# Patient Record
Sex: Female | Born: 1975 | Race: White | Hispanic: No | Marital: Married | Smoking: Never smoker
Health system: Southern US, Community
[De-identification: ages and names within clinical notes are randomized; demographics above are authoritative.]

## PROBLEM LIST (undated history)

## (undated) DIAGNOSIS — A048 Other specified bacterial intestinal infections: Secondary | ICD-10-CM

## (undated) DIAGNOSIS — I471 Supraventricular tachycardia, unspecified: Secondary | ICD-10-CM

## (undated) DIAGNOSIS — O24419 Gestational diabetes mellitus in pregnancy, unspecified control: Secondary | ICD-10-CM

## (undated) DIAGNOSIS — Z8742 Personal history of other diseases of the female genital tract: Secondary | ICD-10-CM

## (undated) DIAGNOSIS — K589 Irritable bowel syndrome without diarrhea: Secondary | ICD-10-CM

## (undated) DIAGNOSIS — R002 Palpitations: Secondary | ICD-10-CM

## (undated) DIAGNOSIS — F419 Anxiety disorder, unspecified: Secondary | ICD-10-CM

## (undated) DIAGNOSIS — K469 Unspecified abdominal hernia without obstruction or gangrene: Secondary | ICD-10-CM

## (undated) DIAGNOSIS — D126 Benign neoplasm of colon, unspecified: Secondary | ICD-10-CM

## (undated) HISTORY — DX: Other specified bacterial intestinal infections: A04.8

## (undated) HISTORY — DX: Palpitations: R00.2

## (undated) HISTORY — DX: Unspecified abdominal hernia without obstruction or gangrene: K46.9

## (undated) HISTORY — PX: PLACEMENT OF BREAST IMPLANTS: SHX6334

## (undated) HISTORY — DX: Gestational diabetes mellitus in pregnancy, unspecified control: O24.419

## (undated) HISTORY — DX: Supraventricular tachycardia: I47.1

## (undated) HISTORY — DX: Irritable bowel syndrome, unspecified: K58.9

## (undated) HISTORY — DX: Benign neoplasm of colon, unspecified: D12.6

## (undated) HISTORY — DX: Personal history of other diseases of the female genital tract: Z87.42

## (undated) HISTORY — DX: Supraventricular tachycardia, unspecified: I47.10

## (undated) HISTORY — DX: Anxiety disorder, unspecified: F41.9

## (undated) HISTORY — PX: OTHER SURGICAL HISTORY: SHX169

---

## 2000-03-03 ENCOUNTER — Other Ambulatory Visit: Admission: RE | Admit: 2000-03-03 | Discharge: 2000-03-03 | Payer: Self-pay | Admitting: Gynecology

## 2005-02-13 ENCOUNTER — Ambulatory Visit: Payer: Self-pay | Admitting: Internal Medicine

## 2008-02-15 ENCOUNTER — Inpatient Hospital Stay (HOSPITAL_COMMUNITY): Admission: AD | Admit: 2008-02-15 | Discharge: 2008-02-22 | Payer: Self-pay | Admitting: Obstetrics & Gynecology

## 2008-02-15 ENCOUNTER — Ambulatory Visit: Payer: Self-pay | Admitting: Family

## 2009-05-13 ENCOUNTER — Emergency Department (HOSPITAL_COMMUNITY): Admission: EM | Admit: 2009-05-13 | Discharge: 2009-05-13 | Payer: Self-pay | Admitting: Emergency Medicine

## 2009-05-24 HISTORY — PX: DOPPLER ECHOCARDIOGRAPHY: SHX263

## 2009-08-30 ENCOUNTER — Encounter (HOSPITAL_COMMUNITY): Admission: RE | Admit: 2009-08-30 | Discharge: 2009-10-17 | Payer: Self-pay | Admitting: Cardiology

## 2009-08-30 HISTORY — PX: OTHER SURGICAL HISTORY: SHX169

## 2010-01-11 IMAGING — US US OB COMP EACH ADDL GEST+14 WKS
1 series · 14 of 28 positions shown · non-contrast
Comparison: none

OBSTETRICAL ULTRASOUND:
 This ultrasound exam was performed in the [HOSPITAL] Ultrasound Department.  The OB US report was generated in the AS system, and faxed to the ordering physician.  This report is also available in [REDACTED] PACS.

[Series 1: us ob comp +14 wk · 14 of 77 slices shown]
[im 3/77]
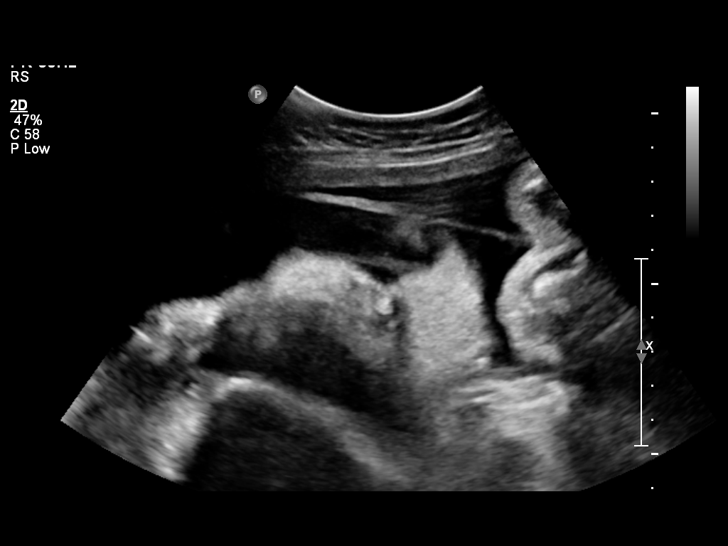
[im 9/77]
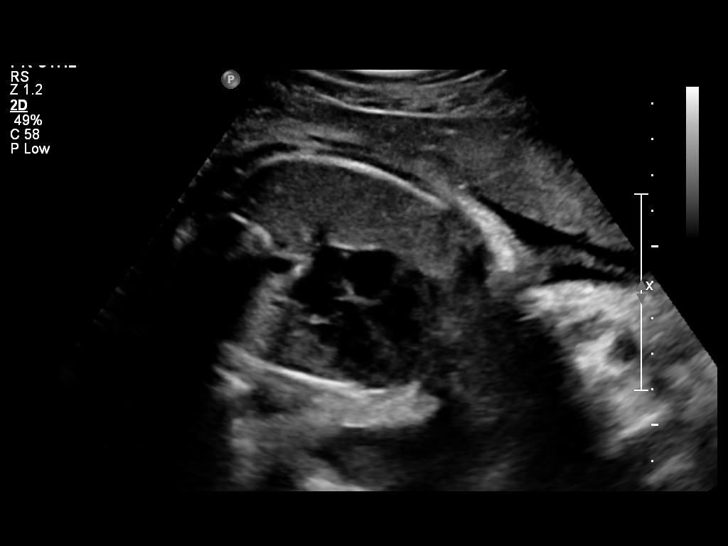
[im 15/77]
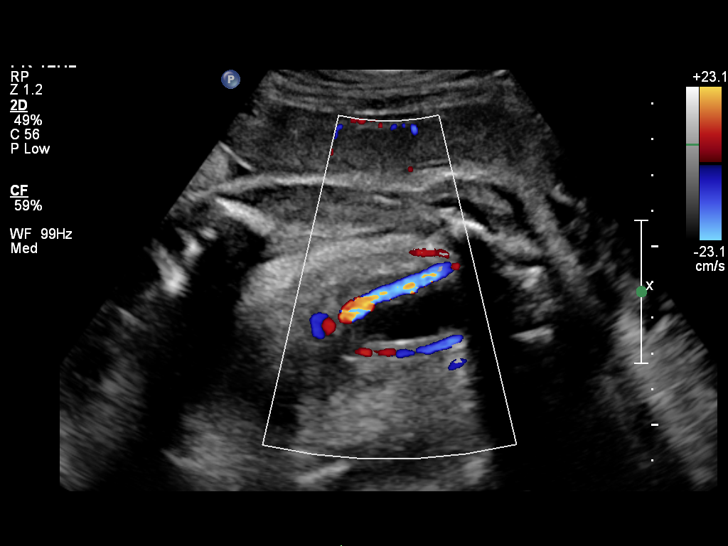
[im 20/77]
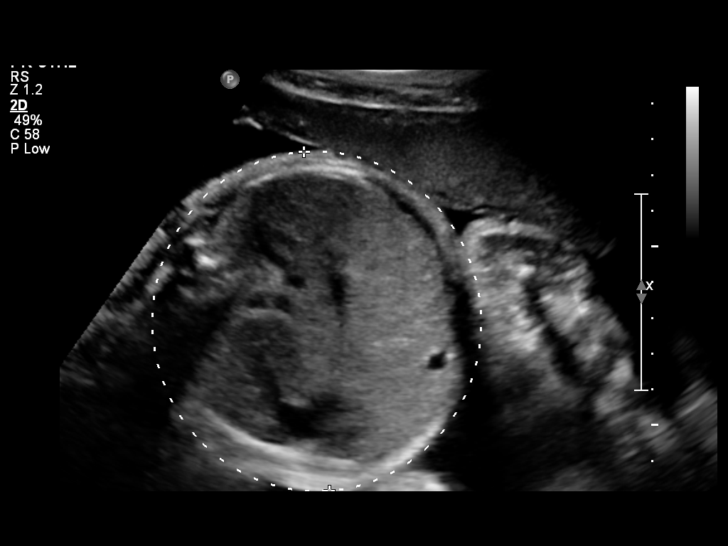
[im 26/77]
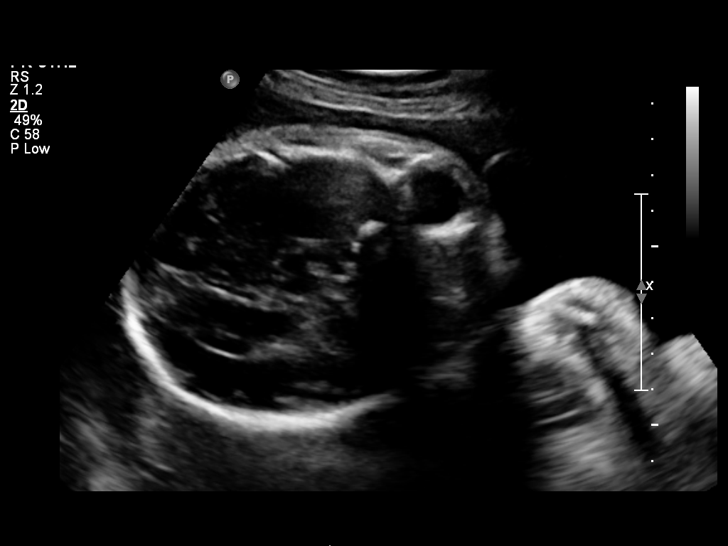
[im 31/77]
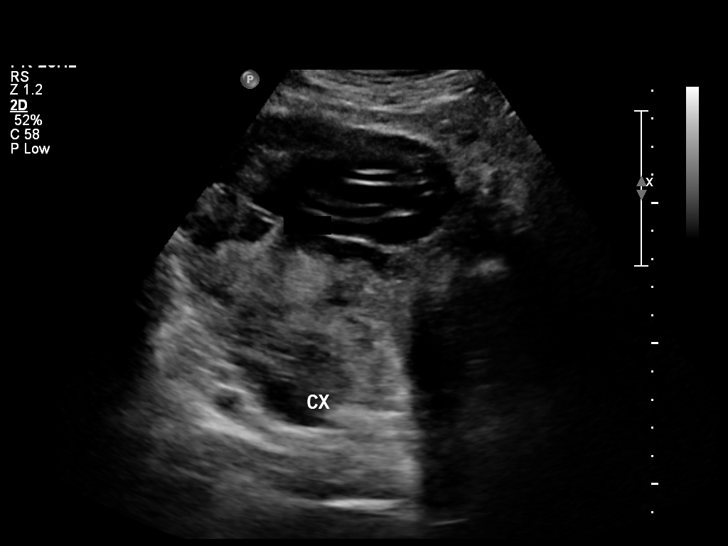
[im 37/77]
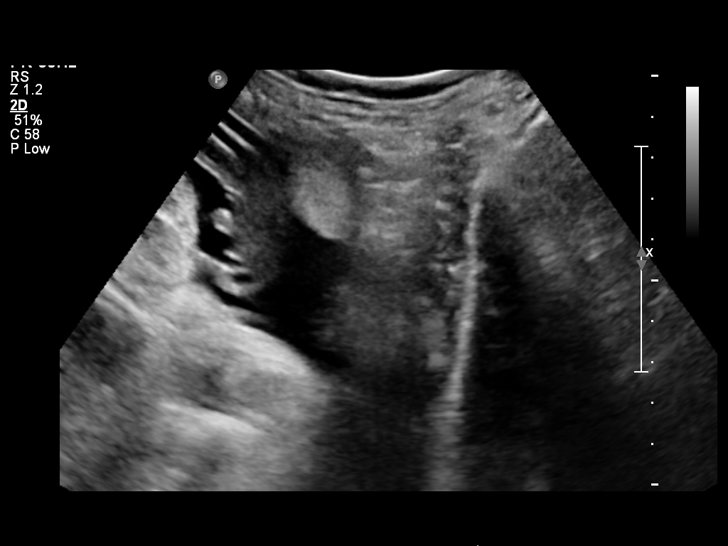
[im 43/77]
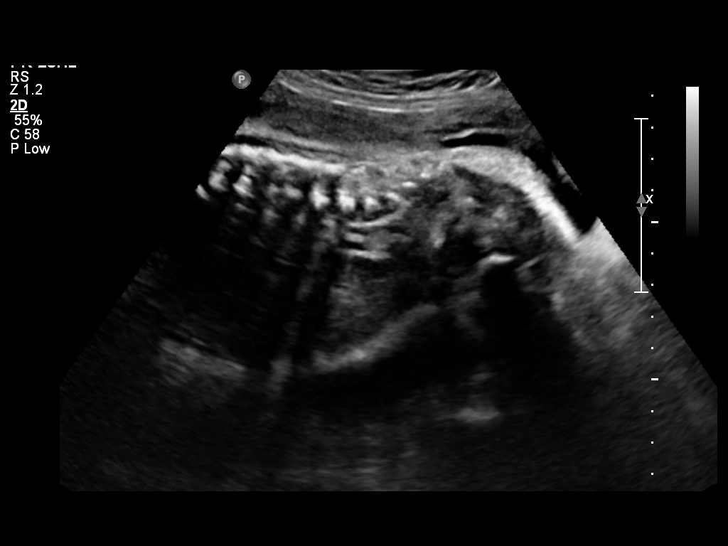
[im 48/77]
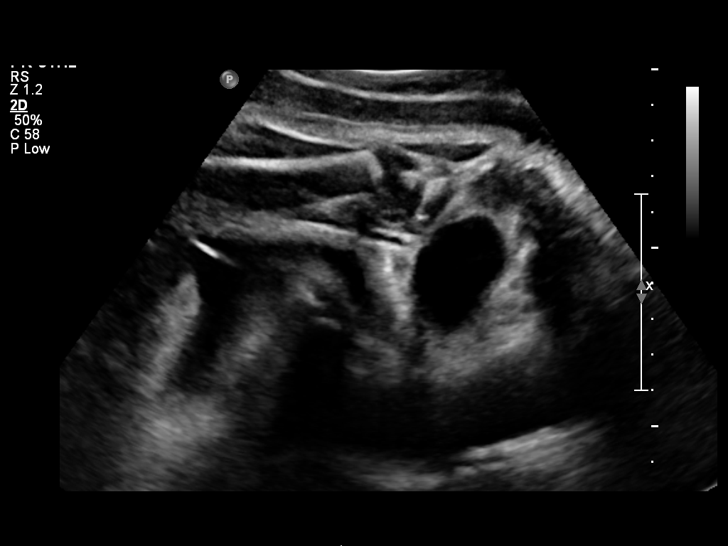
[im 54/77]
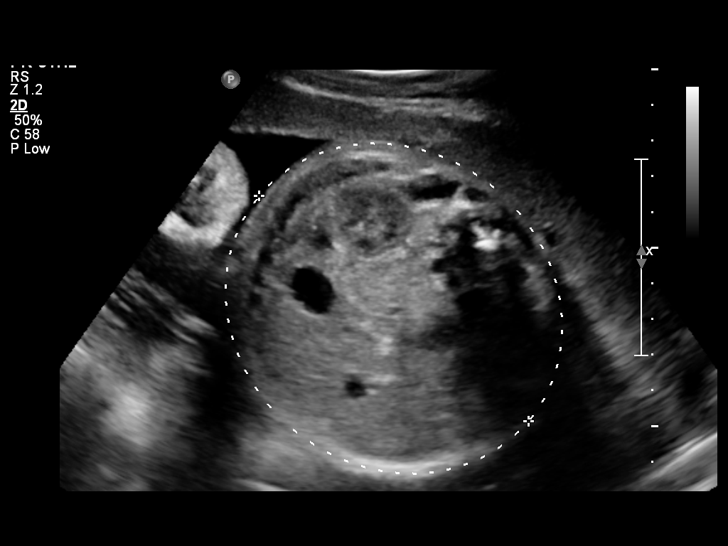
[im 60/77]
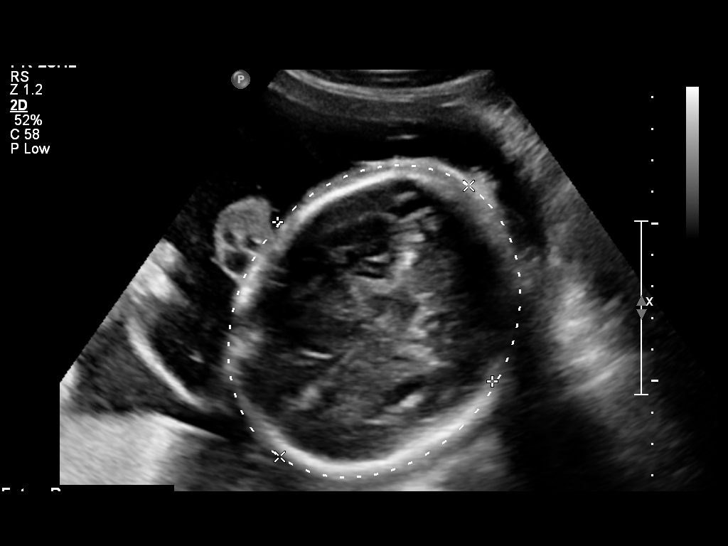
[im 65/77]
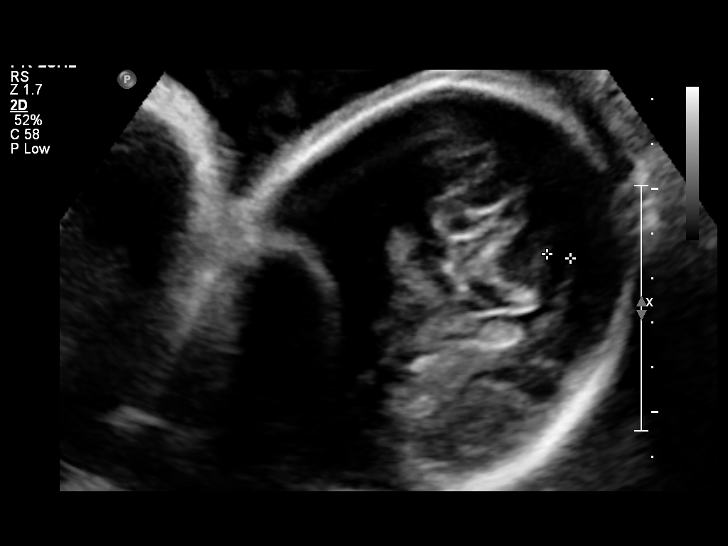
[im 71/77]
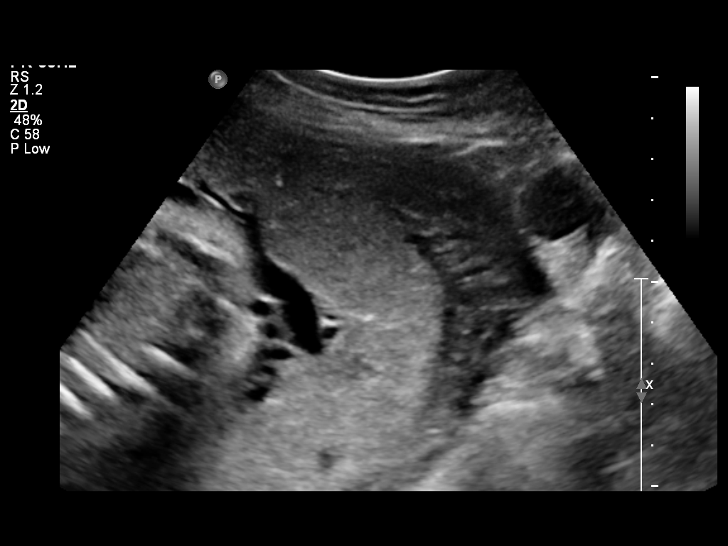
[im 77/77]
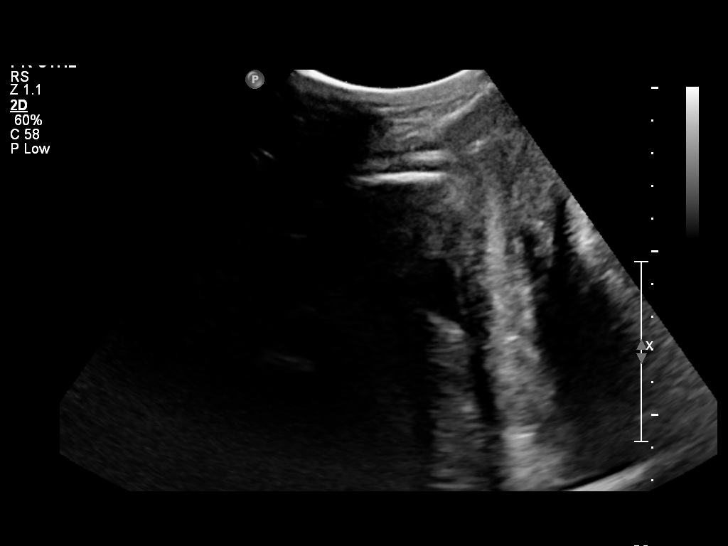

[14 of 28 positions shown; findings below may reference images not displayed]

IMPRESSION: See AS Obstetric US report.

## 2010-04-15 LAB — URINALYSIS, ROUTINE W REFLEX MICROSCOPIC
Bilirubin Urine: NEGATIVE
Hgb urine dipstick: NEGATIVE
Ketones, ur: 15 mg/dL — AB
Protein, ur: NEGATIVE mg/dL
Specific Gravity, Urine: 1.004 — ABNORMAL LOW (ref 1.005–1.030)
pH: 7 (ref 5.0–8.0)

## 2010-04-15 LAB — POCT I-STAT, CHEM 8
BUN: 7 mg/dL (ref 6–23)
Chloride: 104 mEq/L (ref 96–112)
HCT: 36 % (ref 36.0–46.0)
Hemoglobin: 12.2 g/dL (ref 12.0–15.0)
Sodium: 139 mEq/L (ref 135–145)

## 2010-04-15 LAB — DIFFERENTIAL
Basophils Relative: 0 % (ref 0–1)
Eosinophils Relative: 1 % (ref 0–5)
Lymphocytes Relative: 25 % (ref 12–46)
Monocytes Absolute: 0.5 10*3/uL (ref 0.1–1.0)
Monocytes Relative: 7 % (ref 3–12)

## 2010-04-15 LAB — CBC
MCV: 93.5 fL (ref 78.0–100.0)
RBC: 3.65 MIL/uL — ABNORMAL LOW (ref 3.87–5.11)
RDW: 13 % (ref 11.5–15.5)
WBC: 8.4 10*3/uL (ref 4.0–10.5)

## 2010-04-15 LAB — POCT CARDIAC MARKERS
Myoglobin, poc: 38.3 ng/mL (ref 12–200)
Myoglobin, poc: 78.9 ng/mL (ref 12–200)
Troponin i, poc: <0.05 ng/mL (ref 0.00–0.09)

## 2010-04-15 LAB — D-DIMER, QUANTITATIVE: D-Dimer, Quant: <0.22 ug/mL-FEU (ref 0.00–0.48)

## 2010-04-15 LAB — PROTIME-INR: INR: 1.11 (ref 0.00–1.49)

## 2010-05-12 LAB — URINALYSIS, ROUTINE W REFLEX MICROSCOPIC
Glucose, UA: NEGATIVE mg/dL
Ketones, ur: NEGATIVE mg/dL
Leukocytes, UA: NEGATIVE
Protein, ur: NEGATIVE mg/dL
Urobilinogen, UA: 0.2 mg/dL (ref 0.0–1.0)

## 2010-05-12 LAB — GLUCOSE, CAPILLARY
Glucose-Capillary: 114 mg/dL — ABNORMAL HIGH (ref 70–99)
Glucose-Capillary: 118 mg/dL — ABNORMAL HIGH (ref 70–99)
Glucose-Capillary: 120 mg/dL — ABNORMAL HIGH (ref 70–99)
Glucose-Capillary: 124 mg/dL — ABNORMAL HIGH (ref 70–99)
Glucose-Capillary: 67 mg/dL — ABNORMAL LOW (ref 70–99)
Glucose-Capillary: 67 mg/dL — ABNORMAL LOW (ref 70–99)
Glucose-Capillary: 74 mg/dL (ref 70–99)
Glucose-Capillary: 75 mg/dL (ref 70–99)
Glucose-Capillary: 79 mg/dL (ref 70–99)
Glucose-Capillary: 83 mg/dL (ref 70–99)
Glucose-Capillary: 90 mg/dL (ref 70–99)

## 2010-05-12 LAB — CROSSMATCH
ABO/RH(D): A POS
Antibody Screen: NEGATIVE
Antibody Screen: NEGATIVE

## 2010-05-12 LAB — ABO/RH: ABO/RH(D): A POS

## 2010-05-12 LAB — CBC
HCT: 30.6 % — ABNORMAL LOW (ref 36.0–46.0)
Hemoglobin: 10.6 g/dL — ABNORMAL LOW (ref 12.0–15.0)
MCV: 98.8 fL (ref 78.0–100.0)
Platelets: 119 10*3/uL — ABNORMAL LOW (ref 150–400)
RBC: 3.1 MIL/uL — ABNORMAL LOW (ref 3.87–5.11)
WBC: 8.9 10*3/uL (ref 4.0–10.5)

## 2010-06-10 NOTE — Discharge Summary (Signed)
[name], Amber Case                ACCOUNT NO.:  [id number]   MEDICAL RECORD NO.:  [id number]          PATIENT TYPE:  INP   LOCATION:  9157                          FACILITY:  [facility]   PHYSICIAN:  Tanya S. Shawnie Pons, M.D.   DATE OF BIRTH:  1975-05-31   DATE OF ADMISSION:  02/15/2008  DATE OF DISCHARGE:  02/22/2008                               DISCHARGE SUMMARY   ADMISSION DIAGNOSES:  1. A 32-6/7 week intrauterine pregnancy.  2. Dichorionic-diamniotic twin gestation.  3. Complete placenta previa.  4. A1gestational diabetes mellitus.   DISCHARGE DIAGNOSES:  1. A 33-6/7 week intrauterine pregnancy.  2. Complete posterior previa.  3. Hemodynamically stable.  4. Dichorionic-diamniotic twin gestation.  5. A1 gestational diabetes mellitus well controlled.   HISTORY:  This is a 35 year old G2, P1-0-0-1 who received her care with  Dr. Allena Katz at [facility].  As instructed, she presented  at [facility] when she had her first episode of vaginal  bleeding on February 15, 2008 with a known posterior placenta previa.  She had scant amount of blood and irregular contractions for the first  couple of days, then had no active bleeding.  She did remain  hemodynamically stable throughout.  Her hemoglobin was 10.6.  She  received betamethasone x2 and short course of Procardia, which was  discontinued February 21, 2008.  Her CBG were within target range other  than some mild elevation after she received betamethasone.  Her  discharge fasting CBG is 67.  Her ultrasound of February 16, 2008 shows  di-di concordant twin.  AGA asymmetric and a cervical length of 4.3.  Fetal well-being was documented by intermittent monitoring.  On February 22, 2008, she was discharged home in stable condition.   MEDICATIONS:  Prenatal vitamins one a day.   INSTRUCTIONS:  Pelvic rest and modified bedrest.  She will do home  glucose monitoring as advised by her primary MD.  She will have a  followup appointment with Dr. Allena Katz Tuesday in 6 days.      Deirdre Christy Gentles, C.N.M.      Shelbie Proctor. Shawnie Pons, M.D.  Electronically Signed   DP/MEDQ  D:  02/22/2008  T:  02/23/2008  Job:  [id number]

## 2011-06-17 ENCOUNTER — Encounter: Payer: Self-pay | Admitting: *Deleted

## 2014-09-24 DIAGNOSIS — N6009 Solitary cyst of unspecified breast: Secondary | ICD-10-CM | POA: Insufficient documentation

## 2014-09-24 DIAGNOSIS — N63 Unspecified lump in unspecified breast: Secondary | ICD-10-CM | POA: Insufficient documentation

## 2015-08-01 DIAGNOSIS — F410 Panic disorder [episodic paroxysmal anxiety] without agoraphobia: Secondary | ICD-10-CM | POA: Insufficient documentation

## 2016-01-07 DIAGNOSIS — K581 Irritable bowel syndrome with constipation: Secondary | ICD-10-CM | POA: Insufficient documentation

## 2016-10-29 DIAGNOSIS — I471 Supraventricular tachycardia: Secondary | ICD-10-CM | POA: Insufficient documentation

## 2016-12-03 DIAGNOSIS — R9439 Abnormal result of other cardiovascular function study: Secondary | ICD-10-CM | POA: Insufficient documentation

## 2016-12-24 DIAGNOSIS — K760 Fatty (change of) liver, not elsewhere classified: Secondary | ICD-10-CM | POA: Insufficient documentation

## 2017-11-17 LAB — HM MAMMOGRAPHY

## 2017-11-29 LAB — HM PAP SMEAR: HM Pap smear: NEGATIVE

## 2017-12-02 ENCOUNTER — Ambulatory Visit (INDEPENDENT_AMBULATORY_CARE_PROVIDER_SITE_OTHER): Payer: PRIVATE HEALTH INSURANCE | Admitting: Osteopathic Medicine

## 2017-12-02 ENCOUNTER — Encounter: Payer: Self-pay | Admitting: Osteopathic Medicine

## 2017-12-02 VITALS — BP 105/68 | HR 68 | Temp 98.4°F | Ht 64.0 in | Wt 130.0 lb

## 2017-12-02 DIAGNOSIS — K581 Irritable bowel syndrome with constipation: Secondary | ICD-10-CM

## 2017-12-02 DIAGNOSIS — F419 Anxiety disorder, unspecified: Secondary | ICD-10-CM | POA: Diagnosis not present

## 2017-12-02 DIAGNOSIS — I471 Supraventricular tachycardia: Secondary | ICD-10-CM

## 2017-12-02 DIAGNOSIS — F410 Panic disorder [episodic paroxysmal anxiety] without agoraphobia: Secondary | ICD-10-CM

## 2017-12-02 DIAGNOSIS — K760 Fatty (change of) liver, not elsewhere classified: Secondary | ICD-10-CM

## 2017-12-02 DIAGNOSIS — Z8632 Personal history of gestational diabetes: Secondary | ICD-10-CM

## 2017-12-02 DIAGNOSIS — R002 Palpitations: Secondary | ICD-10-CM

## 2017-12-02 MED ORDER — BUPROPION HCL ER (XL) 150 MG PO TB24: 150.0000 mg | ORAL_TABLET | ORAL | 0 refills | Status: DC

## 2017-12-02 MED ORDER — LORAZEPAM 0.5 MG PO TABS: 0.5000 mg | ORAL_TABLET | Freq: Two times a day (BID) | ORAL | 0 refills | Status: DC | PRN

## 2017-12-02 MED ORDER — SERTRALINE HCL 50 MG PO TABS: 50.0000 mg | ORAL_TABLET | Freq: Every day | ORAL | 3 refills | Status: DC

## 2017-12-02 NOTE — Progress Notes (Signed)
HPI: Amber Case is a 42 y.o. female who  has a past medical history of Gestational diabetes and Palpitations.  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  Concerned about anxiety, difficult time focusing.   Previous psych meds per record review:  Sertaline 25 --> 100 mg last Rx 07/2017 at 100 mg Ativan 0.5 mg 07/2016, 07/2017 Celexa 20 mg in 2013  Above list was confirmed with patient, she states the Celexa caused anxiety to be worse, Ativan was used very infrequently, she did pretty well on the sertraline 50 mg, was cutting 100 mg tablets in half for a while.  Cardiac CT scan 11/2016 to follow-up on abnormal stress test, coronary artery calcium score 0, normal LV EF 60 to 65%, thoracic aorta appears normal.  Fatty liver was demonstrated on the CT.   History of IBS-C and fatty liver, visit with GI 01/21/2017.  Right upper quadrant ultrasound 01/01/2017 showed normal echotexture of the liver, normal LFTs, it is recommended to go by the CT.  Patient had made some lifestyle modifications including diet/exercise and limiting alcohol to help with the fatty liver issue.  Radiology notes reviewed, abnormal echo concerning for possible ischemia but normal EKG, given low risk, patient was scheduled for CT to evaluate coronary arteries  She reports occasional palpitations, used to have runs of really fast heartbeat, now seems to be noticing occasional squeezing in the chest, triggered more by anxiety, occasionally with caffeine. Other preventive care:  Never smoker Rare EtOH use Monogamous, cisgender, heterosexual, declines STI testing  Following w/ OBGYN for Pap, Mammo Reprots Tdap 2017 No Flu  vax  No FH Breast/Colon/Ovarian CA      Past medical, surgical, social and family history reviewed:  Patient Active Problem  List   Diagnosis Date Noted  . Anxiety 12/02/2017  . Fatty infiltration of liver 12/24/2016  . Abnormal stress echo 12/03/2016  . PSVT (paroxysmal supraventricular tachycardia) ([place]) 10/29/2016  . Irritable bowel syndrome with constipation 01/07/2016  . Panic disorder 08/01/2015  . Breast cyst 09/24/2014    Past Surgical History:  Procedure Laterality Date  . CESAREAN SECTION    . DOPPLER ECHOCARDIOGRAPHY  05/24/2009   trace pulmonary regurgitation.  trace tricuspid regurgitation. LV normal.  Trace mitral regurgitation.    . heart monitor     NO RESULT READ  . nuclear testing  08/30/2009   NM MYOCAR MULTI w/SPECT; Mild septal hypokinesis.  EF 63%    Social History   Tobacco Use  . Smoking status: Never Smoker  Substance Use Topics  . Alcohol use: No    Family History  Problem Relation Age of Onset  . Heart murmur Mother      Current medication list and allergy/intolerance information reviewed:    Current Outpatient Medications  Medication Sig Dispense Refill  . citalopram (CELEXA) 20 MG tablet Take 20 mg by mouth daily.    Marland Kitchen LORazepam (ATIVAN) 0.5 MG tablet Take 0.5 mg by mouth every 8 (eight) hours.     No current facility-administered medications for this visit.     Allergies  Allergen Reactions  . Sulfonamide Derivatives       Review of Systems:  Constitutional:  No  fever, no chills, No recent illness, No unintentional weight changes. No significant fatigue.   HEENT: No  headache, no vision change, no hearing change,  No sore throat, No  sinus pressure  Cardiac: No  chest pain, No  pressure, No palpitations, No  Orthopnea  Respiratory:  No  shortness of breath. No  Cough  Gastrointestinal: No  abdominal pain, No  nausea, No  vomiting,  No  blood in stool, +diarrhea, No  constipation   Musculoskeletal: No new myalgia/arthralgia  Skin: No  Rash, No other wounds/concerning lesions  Genitourinary: No  incontinence, No  abnormal genital bleeding, No  abnormal genital discharge  Hem/Onc: No  easy bruising/bleeding, No  abnormal lymph node  Endocrine: No cold intolerance,  No heat intolerance. No polyuria/polydipsia/polyphagia   Neurologic: No  weakness, No  dizziness, No  slurred speech/focal weakness/facial droop  Psychiatric: No  concerns with depression, +concerns with anxiety, No sleep problems, No mood problems  Depression screen PHQ 2/9 12/02/2017  Decreased Interest 0  Down, Depressed, Hopeless 1  PHQ - 2 Score 1   GAD 7 : Generalized Anxiety Score 12/02/2017  Nervous, Anxious, on Edge 2  Control/stop worrying 2  Worry too much - different things 2  Trouble relaxing 2  Restless 1  Easily annoyed or irritable 2  Afraid - awful might happen 1  Total GAD 7 Score 12  Anxiety Difficulty Somewhat difficult      Exam:  BP 105/68 (BP Location: Left Arm, Patient Position: Sitting, Cuff Size: Small)   Pulse 68   Temp 98.4 F (36.9 C) (Oral)   Ht 5\' 4"  (1.626 m)   Wt 130 lb (59 kg)   BMI 22.31 kg/m   Constitutional: VS see above. General Appearance: alert, well-developed, well-nourished, NAD  Eyes: Normal lids and conjunctive, non-icteric sclera  Ears, Nose, Mouth, Throat: MMM, Normal external inspection ears/nares/mouth/lips/gums. TM normal bilaterally. Pharynx/tonsils no erythema, no exudate. Nasal mucosa normal.   Neck: No masses, trachea midline. No thyroid enlargement. No tenderness/mass appreciated. No lymphadenopathy  Respiratory: Normal respiratory effort. no wheeze, no rhonchi, no rales  Cardiovascular: S1/S2 normal, no murmur, no rub/gallop auscultated. RRR. No lower extremity edema. Pedal pulse II/IV bilaterally DP and PT. No carotid bruit or JVD. No abdominal aortic bruit.  Gastrointestinal: Nontender, no masses. No hepatomegaly, no splenomegaly. No hernia appreciated. Bowel sounds normal. Rectal exam deferred.   Musculoskeletal: Gait normal. No clubbing/cyanosis of digits.   Neurological: Normal  balance/coordination. No tremor. No cranial nerve deficit on limited exam. Motor and sensation intact and symmetric. Cerebellar reflexes intact.   Skin: warm, dry, intact. No rash/ulcer. No concerning nevi or subq nodules on limited exam.    Psychiatric: Normal judgment/insight. Normal mood and affect. Oriented x3.      ASSESSMENT/PLAN: The primary encounter diagnosis was Anxiety. Diagnoses of Panic disorder, PSVT (paroxysmal supraventricular tachycardia) ([place]), Irritable bowel syndrome with constipation, Fatty infiltration of liver, History of gestational diabetes, and Palpitations were also pertinent to this visit.   Orders Placed This Encounter  Procedures  . CBC  . COMPLETE METABOLIC PANEL WITH GFR  . TSH  . Hemoglobin A1c  . Holter monitor - 48 hour   Capitation sound most likely PVCs.  Given normal cardiac work-up a year ago for CAD, will defer EKG today and cardiology referral, will get Holter monitor to confirm PVC and consider for cardiology follow-up.  We discussed possibility of trying Wellbutrin given concentration issues and possible ADHD history, patient is nervous to try something different, since she did okay on the Zoloft before she would like to start this again.   Patient Instructions  Try putting you back on sertraline/Zoloft  at 50 mg.  Please let me know if you would like to pursue testing for attention deficit disorder, I can place a referral for psychological evaluation.  Let's plan to follow-up in 4 to 6 weeks to see how you are doing on the medicine, come see me sooner if needed!  We will go ahead and refill Ativan to have on hand as needed.  We will get some blood work today to evaluate other possible causes for change in anxiety/mood with palpitations and also to monitor liver function.  Will get Holter monitor to evaluate heart palpitations.  If you do not get a call sometime in the next week to get the Holter monitor set up, please let us  know.      Visit summary with medication list and pertinent instructions was printed for patient to review. All questions at time of visit were answered - patient instructed to contact office with any additional concerns. ER/RTC precautions were reviewed with the patient.   Follow-up plan: Return for Check anxiety in 4 to 6 weeks, sooner as needed.   Please note: voice recognition software was used to produce this document, and typos may escape review. Please contact Dr. Sheppard Coil for any needed clarifications.

## 2017-12-02 NOTE — Patient Instructions (Addendum)
Try putting you back on sertraline/Zoloft at 50 mg.  Please let me know if you would like to pursue testing for attention deficit disorder, I can place a referral for psychological evaluation.  Let's plan to follow-up in 4 to 6 weeks to see how you are doing on the medicine, come see me sooner if needed!  We will go ahead and refill Ativan to have on hand as needed.  We will get some blood work today to evaluate other possible causes for change in anxiety/mood with palpitations and also to monitor liver function.  Will get Holter monitor to evaluate heart palpitations.  If you do not get a call sometime in the next week to get the Holter monitor set up, please let us know.

## 2017-12-03 ENCOUNTER — Telehealth: Payer: Self-pay | Admitting: Osteopathic Medicine

## 2017-12-03 LAB — COMPLETE METABOLIC PANEL WITH GFR
AG Ratio: 1.8 (calc) (ref 1.0–2.5)
ALKALINE PHOSPHATASE (APISO): 38 U/L (ref 33–115)
ALT: 10 U/L (ref 6–29)
AST: 15 U/L (ref 10–30)
Albumin: 4.4 g/dL (ref 3.6–5.1)
BILIRUBIN TOTAL: 0.4 mg/dL (ref 0.2–1.2)
BUN: 9 mg/dL (ref 7–25)
CHLORIDE: 105 mmol/L (ref 98–110)
CO2: 28 mmol/L (ref 20–32)
CREATININE: 0.78 mg/dL (ref 0.50–1.10)
Calcium: 9.3 mg/dL (ref 8.6–10.2)
GFR, Est African American: 109 mL/min/{1.73_m2} (ref >=60)
GFR, Est Non African American: 94 mL/min/{1.73_m2} (ref >=60)
Globulin: 2.4 g/dL (calc) (ref 1.9–3.7)
Glucose, Bld: 93 mg/dL (ref 65–99)
Potassium: 4.2 mmol/L (ref 3.5–5.3)
Sodium: 138 mmol/L (ref 135–146)
TOTAL PROTEIN: 6.8 g/dL (ref 6.1–8.1)

## 2017-12-03 LAB — CBC
HEMATOCRIT: 37 % (ref 35.0–45.0)
HEMOGLOBIN: 12.5 g/dL (ref 11.7–15.5)
MCH: 32.1 pg (ref 27.0–33.0)
MCHC: 33.8 g/dL (ref 32.0–36.0)
MCV: 95.1 fL (ref 80.0–100.0)
MPV: 12 fL (ref 7.5–12.5)
PLATELETS: 247 10*3/uL (ref 140–400)
RBC: 3.89 10*6/uL (ref 3.80–5.10)
RDW: 12.2 % (ref 11.0–15.0)
WBC: 7.7 10*3/uL (ref 3.8–10.8)

## 2017-12-03 LAB — HEMOGLOBIN A1C
Hgb A1c MFr Bld: 5.2 % of total Hgb (ref <5.7)
Mean Plasma Glucose: 103 (calc)
eAG (mmol/L): 5.7 (calc)

## 2017-12-03 LAB — TSH: TSH: 1.89 mIU/L

## 2017-12-03 NOTE — Telephone Encounter (Signed)
LAM for patient to call back to schedule monitor appt.

## 2017-12-06 ENCOUNTER — Telehealth: Payer: Self-pay | Admitting: Osteopathic Medicine

## 2017-12-06 NOTE — Telephone Encounter (Signed)
LAM for patient to call back regarding scheduling 48 hour monitor

## 2017-12-07 ENCOUNTER — Telehealth: Payer: Self-pay | Admitting: *Deleted

## 2017-12-07 NOTE — Telephone Encounter (Signed)
Pt has appt on Monday, 11/18 for monitor and I was calling to see if we could reschedule her for Tues the 19th or Thurs. The 21st

## 2017-12-14 ENCOUNTER — Ambulatory Visit (INDEPENDENT_AMBULATORY_CARE_PROVIDER_SITE_OTHER): Payer: PRIVATE HEALTH INSURANCE

## 2017-12-14 DIAGNOSIS — R002 Palpitations: Secondary | ICD-10-CM | POA: Diagnosis not present

## 2017-12-24 ENCOUNTER — Other Ambulatory Visit: Payer: Self-pay | Admitting: Osteopathic Medicine

## 2017-12-24 DIAGNOSIS — F419 Anxiety disorder, unspecified: Secondary | ICD-10-CM

## 2017-12-30 ENCOUNTER — Encounter: Payer: Self-pay | Admitting: Osteopathic Medicine

## 2017-12-30 ENCOUNTER — Ambulatory Visit (INDEPENDENT_AMBULATORY_CARE_PROVIDER_SITE_OTHER): Payer: PRIVATE HEALTH INSURANCE | Admitting: Osteopathic Medicine

## 2017-12-30 VITALS — BP 101/65 | HR 71 | Temp 98.5°F | Wt 131.0 lb

## 2017-12-30 DIAGNOSIS — F419 Anxiety disorder, unspecified: Secondary | ICD-10-CM

## 2017-12-30 DIAGNOSIS — B009 Herpesviral infection, unspecified: Secondary | ICD-10-CM | POA: Diagnosis not present

## 2017-12-30 DIAGNOSIS — F410 Panic disorder [episodic paroxysmal anxiety] without agoraphobia: Secondary | ICD-10-CM

## 2017-12-30 MED ORDER — VALACYCLOVIR HCL 1 G PO TABS: 1000.0000 mg | ORAL_TABLET | Freq: Three times a day (TID) | ORAL | 2 refills | Status: DC

## 2017-12-30 MED ORDER — SERTRALINE HCL 50 MG PO TABS: 50.0000 mg | ORAL_TABLET | Freq: Every day | ORAL | 3 refills | Status: DC

## 2017-12-30 NOTE — Progress Notes (Signed)
HPI: Amber Case is a 42 y.o. female who  has a past medical history of Anxiety, Gestational diabetes, History of abnormal cervical Pap smear, and Palpitations.  she presents to [facility] today, 12/30/17,  for chief complaint of: Follow up anxiety/concentration, palpitations    Last visit we restarted Zoloft, discussed consider augment with Wellbutrin but pt was nervous to try new meds. Also consider referral for ADHD testing. Ativan refilled to have on hand as needed.    Today reports dong well on Zoloft, has used Ativan twice.    Previous psych meds:   Sertaline 25 --> 100 mg, last Rx 07/2017 at 100 mg, she did pretty well on the sertraline 50 mg, was cutting 100 mg tablets in half for a while.  Ativan 0.5 mg 07/2016, 07/2017 was used very infrequently,  Celexa 20 mg in 2013, caused anxiety to be worse   GAD 7 : Generalized Anxiety Score 12/30/2017 12/02/2017  Nervous, Anxious, on Edge 1 2  Control/stop worrying 1 2  Worry too much - different things 1 2  Trouble relaxing 1 2  Restless 0 1  Easily annoyed or irritable 0 2  Afraid - awful might happen 0 1  Total GAD 7 Score 4 12  Anxiety Difficulty Somewhat difficult Somewhat difficult    Depression screen [facility] 2/9 12/30/2017 12/02/2017  Decreased Interest 1 0  Down, Depressed, Hopeless 0 1  PHQ - 2 Score 1 1  Altered sleeping 1 -  Tired, decreased energy 1 -  Change in appetite 0 -  Feeling bad or failure about yourself  1 -  Trouble concentrating 1 -  Moving slowly or fidgety/restless 0 -  Suicidal thoughts 0 -  PHQ-9 Score 5 -  Difficult doing work/chores Somewhat difficult -     Palpitations: Holter monitor ordered and was normal.   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      Past medical, surgical, social and family history  reviewed    Current medication list and allergy/intolerance information reviewed:    Current Outpatient Medications  Medication Sig Dispense Refill  . LORazepam (ATIVAN) 0.5 MG tablet Take 1 tablet (0.5 mg total) by mouth 2 (two) times daily as needed (severe anxiety/panic). 15 tablet 0  . sertraline (ZOLOFT) 50 MG tablet Take 1 tablet (50 mg total) by mouth daily. Can start with 1 tablet / 25 mg total p.o. daily for the first 1 to 2 weeks 30 tablet 3   No current facility-administered medications for this visit.     Allergies  Allergen Reactions  . Sulfa Antibiotics Hives  . Sulfonamide Derivatives       Review of Systems:  Constitutional:  No  fever, no chills, No recent illness,   HEENT: No  headache  Cardiac: No  chest pain, No  pressure, No palpitations  Respiratory:  No  shortness of breath. No  Cough   Psychiatric: No  concerns with depression, +concerns with anxiety but improved, No sleep problems, No mood problems       Exam:  BP 101/65   Pulse 71   Temp 98.5 F (36.9 C) (Oral)   Wt 131 lb (59.4 kg)   BMI 22.49 kg/m   Constitutional: VS see above. General Appearance: alert, well-developed, well-nourished, NAD  Respiratory: Normal respiratory effort.   Musculoskeletal: Gait normal. No clubbing/cyanosis of  digits.   Psychiatric: Normal judgment/insight. Normal mood and affect. Oriented x3.      ASSESSMENT/PLAN: The primary encounter diagnosis was Anxiety. Diagnoses of Panic disorder and HSV infection were also pertinent to this visit.   Meds ordered this encounter  Medications  . valACYclovir (VALTREX) 1000 MG tablet    Sig: Take 1 tablet (1,000 mg total) by mouth 3 (three) times daily. For 10 days as needed for outbreak    Dispense:  30 tablet    Refill:  2  . sertraline (ZOLOFT) 50 MG tablet    Sig: Take 1 tablet (50 mg total) by mouth daily.    Dispense:  90 tablet    Refill:  3    Please cancel Wellbutrin   Stable on current  meds      Visit summary with medication list and pertinent instructions was printed for patient to review. All questions at time of visit were answered - patient instructed to contact office with any additional concerns. ER/RTC precautions were reviewed with the patient.   Follow-up plan: Return in about 6 months (around 07/01/2018) for [place] 6 mos or so at your convenience for routine preventive care and fasting labs .   Please note: voice recognition software was used to produce this document, and typos may escape review. Please contact Dr. Sheppard Coil for any needed clarifications.

## 2018-01-12 MED ORDER — OSELTAMIVIR PHOSPHATE 75 MG PO CAPS: 75.0000 mg | ORAL_CAPSULE | Freq: Every day | ORAL | 0 refills | Status: DC

## 2018-01-12 NOTE — Addendum Note (Signed)
Addended by: Dessie Coma on: 01/12/2018 03:05 PM   Modules accepted: Orders

## 2018-01-12 NOTE — Progress Notes (Signed)
Patient left a VM on my phone and I followed protocol for the asymptomatic for the flu per Dr. Sheppard Coil.

## 2018-01-13 ENCOUNTER — Encounter: Payer: Self-pay | Admitting: Osteopathic Medicine

## 2018-02-03 ENCOUNTER — Encounter: Payer: Self-pay | Admitting: Osteopathic Medicine

## 2018-10-12 ENCOUNTER — Encounter: Payer: Self-pay | Admitting: Osteopathic Medicine

## 2018-10-12 ENCOUNTER — Ambulatory Visit (INDEPENDENT_AMBULATORY_CARE_PROVIDER_SITE_OTHER): Payer: PRIVATE HEALTH INSURANCE | Admitting: Osteopathic Medicine

## 2018-10-12 ENCOUNTER — Other Ambulatory Visit: Payer: Self-pay

## 2018-10-12 VITALS — BP 107/67 | HR 74 | Temp 98.8°F | Wt 143.7 lb

## 2018-10-12 DIAGNOSIS — Z23 Encounter for immunization: Secondary | ICD-10-CM | POA: Diagnosis not present

## 2018-10-12 DIAGNOSIS — Z0000 Encounter for general adult medical examination without abnormal findings: Secondary | ICD-10-CM | POA: Diagnosis not present

## 2018-10-12 DIAGNOSIS — R198 Other specified symptoms and signs involving the digestive system and abdomen: Secondary | ICD-10-CM | POA: Diagnosis not present

## 2018-10-12 NOTE — Progress Notes (Signed)
HPI: Amber Case is a 43 y.o. female who  has a past medical history of Anxiety, Gestational diabetes, History of abnormal cervical Pap smear, and Palpitations.  she presents to [facility] today, 10/12/18,  for chief complaint of: Annual physical    Patient here for annual physical / wellness exam.  See preventive care reviewed as below.    Additional concerns today include:  New GI problems: Alternating diarrhea and constipation for past 6 mos, no meds tried, would like to get a cologuard. No nausea, bloody stool, black stool, abdominal pain. No known triggers.       Past medical, surgical, social and family history reviewed:  Patient Active Problem List   Diagnosis Date Noted  . Anxiety 12/02/2017  . Fatty infiltration of liver 12/24/2016  . Abnormal stress echo 12/03/2016  . PSVT (paroxysmal supraventricular tachycardia) ([place]) 10/29/2016  . Irritable bowel syndrome with constipation 01/07/2016  . Panic disorder 08/01/2015  . Breast cyst 09/24/2014    Past Surgical History:  Procedure Laterality Date  . CESAREAN SECTION    . DOPPLER ECHOCARDIOGRAPHY  05/24/2009   trace pulmonary regurgitation.  trace tricuspid regurgitation. LV normal.  Trace mitral regurgitation.    . heart monitor     NO RESULT READ  . nuclear testing  08/30/2009   NM MYOCAR MULTI w/SPECT; Mild septal hypokinesis.  EF 63%    Social History   Tobacco Use  . Smoking status: Never Smoker  . Smokeless tobacco: Never Used  Substance Use Topics  . Alcohol use: No    Family History  Problem Relation Age of Onset  . Heart murmur Mother   . High blood pressure Brother   . Heart attack Maternal Grandmother   . Prostate cancer Paternal Grandfather   . Diabetes Maternal Aunt   . Diabetes Maternal Uncle   . Diabetes Paternal Aunt   . Diabetes Paternal Uncle      Current medication list and allergy/intolerance information reviewed:    Current Outpatient  Medications  Medication Sig Dispense Refill  . LORazepam (ATIVAN) 0.5 MG tablet Take 1 tablet (0.5 mg total) by mouth 2 (two) times daily as needed (severe anxiety/panic). 15 tablet 0  . oseltamivir (TAMIFLU) 75 MG capsule Take 1 capsule (75 mg total) by mouth daily. 10 capsule 0  . sertraline (ZOLOFT) 50 MG tablet Take 1 tablet (50 mg total) by mouth daily. 90 tablet 3  . valACYclovir (VALTREX) 1000 MG tablet Take 1 tablet (1,000 mg total) by mouth 3 (three) times daily. For 10 days as needed for outbreak 30 tablet 2   No current facility-administered medications for this visit.     Allergies  Allergen Reactions  . Sulfa Antibiotics Hives  . Sulfonamide Derivatives       Review of Systems:  Constitutional:  No  fever, no chills, No recent illness, No unintentional weight changes. No significant fatigue.   HEENT: No  headache, no vision change, no hearing change, No sore throat, No  sinus pressure  Cardiac: No  chest pain, No  pressure, No palpitations, No  Orthopnea  Respiratory:  No  shortness of breath. No  Cough  Gastrointestinal: No  abdominal pain, No  nausea, No  vomiting,  No  blood in stool, +diarrhea, +constipation   Musculoskeletal: No new myalgia/arthralgia  Skin: No  Rash, No other wounds/concerning lesions  Genitourinary: No  incontinence, No  abnormal genital bleeding, No abnormal genital discharge  Hem/Onc: No  easy bruising/bleeding, No  abnormal lymph node  Endocrine: No cold intolerance,  No heat intolerance. No polyuria/polydipsia/polyphagia   Neurologic: No  weakness, No  dizziness, No  slurred speech/focal weakness/facial droop  Psychiatric: No  concerns with depression, No  concerns with anxiety, No sleep problems, No mood problems  Exam:  BP 107/67 (BP Location: Left Arm, Patient Position: Sitting, Cuff Size: Normal)   Pulse 74   Temp 98.8 F (37.1 C) (Oral)   Wt 143 lb 11.2 oz (65.2 kg)   BMI 24.67 kg/m   Constitutional: VS see above.  General Appearance: alert, well-developed, well-nourished, NAD  Eyes: Normal lids and conjunctive, non-icteric sclera  Ears, Nose, Mouth, Throat: MMM, Normal external inspection ears/nares/mouth/lips/gums. TM normal bilaterally. Pharynx/tonsils no erythema, no exudate. Nasal mucosa normal.   Neck: No masses, trachea midline. No thyroid enlargement. No tenderness/mass appreciated. No lymphadenopathy  Respiratory: Normal respiratory effort. no wheeze, no rhonchi, no rales  Cardiovascular: S1/S2 normal, no murmur, no rub/gallop auscultated. RRR. No lower extremity edema.   Gastrointestinal: Nontender, no masses. No hepatomegaly, no splenomegaly. No hernia appreciated. Bowel sounds normal. Rectal exam deferred.   Musculoskeletal: Gait normal. No clubbing/cyanosis of digits.   Neurological: Normal balance/coordination. No tremor. No cranial nerve deficit on limited exam. Motor and sensation intact and symmetric. Cerebellar reflexes intact.   Skin: warm, dry, intact. No rash/ulcer. No concerning nevi or subq nodules on limited exam.    Psychiatric: Normal judgment/insight. Normal mood and affect. Oriented x3.         ASSESSMENT/PLAN: The primary encounter diagnosis was Annual physical exam. Diagnoses of Alternating constipation and diarrhea and Need for influenza vaccination were also pertinent to this visit.   Orders Placed This Encounter  Procedures  . Flu Vaccine QUAD 6+ mos PF IM (Fluarix Quad PF)  . CBC  . COMPLETE METABOLIC PANEL WITH GFR  . Lipid panel  . TSH  . Lipase  . Amylase  . Gamma GT    No orders of the defined types were placed in this encounter.   Patient Instructions   General Preventive Care  Most recent routine screening lipids/other labs: 11/2017 no concerns. Will recheck now.   Everyone should have blood pressure checked once per year. Goal 130/80 or less, definitely need medication if 140/90 or higher.   Tobacco: don't!   Alcohol: responsible  moderation is ok for most adults - if you have concerns about your alcohol intake, please talk to me!   Exercise: as tolerated to reduce risk of cardiovascular disease and diabetes. Strength training will also prevent osteoporosis.   Mental health: if need for mental health care (medicines, counseling, other), or concerns about moods, please let me know!   Sexual health: if need for STD testing, or if concerns with libido/pain problems, please let me know!   Reproductive health: If you need to discuss pregnancy, your birth control options, etc, please let me know!   Advanced Directive: Living Will and/or Healthcare Power of Attorney recommended for all adults, regardless of age or health.  Vaccines  Flu vaccine: recommended for almost everyone, every fall.   Shingles vaccine: Shingrix recommended after age 108.  Pneumonia vaccines: Prevnar and Pneumovax recommended after age 57, or sooner if certain medical conditions.  Tetanus booster: Tdap recommended every 10 years. Due 2027.  Cancer screenings   Colon cancer screening: recommended for everyone at age 25. Cologuard is a test for colon cancer screening ONLY, it does NOT diagnose other problems so I wouldn't worry about getting this test, and insurance  will NOT pay for it. If you still want it, please call Cologuard - see attached flyer. See below.   Breast cancer screening: mammogram recommended at least every other year.   Cervical cancer screening: Pap every 1 to 5 years depending on age and other risk factors. Due 2024.   Lung cancer screening: not needed for non-smokers  Infection screenings . HIV: recommended screening at least once age 37-65, more often as needed. . Gonorrhea/Chlamydia: screening as needed.  . Hepatitis C: recommended for anyone born [birth year]-1965 (not needed for you) . TB: certain at-risk populations, or depending on work requirements and/or travel history Other . Bone Density Test: recommended for women at age  83           For alternating diarrhea/constipation, most likely this is irritable bowel syndrome. Can continue probiotics/prebiotics, would add fiber supplement like Metamucil or Citrucel, consider adding peppermint oil OTC. See below for foods that can trigger intestinal irritation. If these measures aren't helping, let me know and I can send referral to gastroenterologist. Again, I do NOT recommend getting a Cologuard test for this issue.    2017 UpToDate Characteristics and sources of common FODMAPs  Word that corresponds to letter in acronym Compounds in this category Foods that contain these compounds  F Fermentable  O Oligosaccharides Fructans, galacto-oligosaccharides Wheat, barley, rye, onion, leek, white part of spring onion, garlic, shallots, artichokes, beetroot, fennel, peas, chicory, pistachio, cashews, legumes, lentils, and chickpeas  D Disaccharides Lactose Milk, custard, ice cream, and yogurt  M Monosaccharides "Free fructose" (fructose in excess of glucose) Apples, pears, mangoes, cherries, watermelon, asparagus, sugar snap peas, honey, high-fructose corn syrup  A And  P Polyols Sorbitol, mannitol, maltitol, and xylitol Apples, pears, apricots, cherries, nectarines, peaches, plums, watermelon, mushrooms, cauliflower, artificially sweetened chewing gum and confectionery  FODMAPs: fermentable oligosaccharides, disaccharides, monosaccharides, and polyols. Adapted by permission from [employer]: [employer]. Agustin Cree, [employer], [place]. Short-chain carbohydrates and functional gastrointestinal disorders. Am J Gastroenterol 2013; 108:707. Copyright  2013. www.nature.com/ajg. Graphic [phone] Version 2.0            Visit summary with medication list and pertinent instructions was printed for patient to review. All questions at time of visit were answered - patient instructed to contact office with any additional concerns or  updates. ER/RTC precautions were reviewed with the patient.    Please note: voice recognition software was used to produce this document, and typos may escape review. Please contact Dr. Sheppard Coil for any needed clarifications.     Follow-up plan: Return in about 1 year (around 10/12/2019) for [place] (call week prior to visit for lab orders).

## 2018-10-12 NOTE — Patient Instructions (Addendum)
General Preventive Care  Most recent routine screening lipids/other labs: 11/2017 no concerns. Will recheck now.   Everyone should have blood pressure checked once per year. Goal 130/80 or less, definitely need medication if 140/90 or higher.   Tobacco: don't!   Alcohol: responsible moderation is ok for most adults - if you have concerns about your alcohol intake, please talk to me!   Exercise: as tolerated to reduce risk of cardiovascular disease and diabetes. Strength training will also prevent osteoporosis.   Mental health: if need for mental health care (medicines, counseling, other), or concerns about moods, please let me know!   Sexual health: if need for STD testing, or if concerns with libido/pain problems, please let me know!   Reproductive health: If you need to discuss pregnancy, your birth control options, etc, please let me know!   Advanced Directive: Living Will and/or Healthcare Power of Attorney recommended for all adults, regardless of age or health.  Vaccines  Flu vaccine: recommended for almost everyone, every fall.   Shingles vaccine: Shingrix recommended after age 59.  Pneumonia vaccines: Prevnar and Pneumovax recommended after age 91, or sooner if certain medical conditions.  Tetanus booster: Tdap recommended every 10 years. Due 2027.  Cancer screenings   Colon cancer screening: recommended for everyone at age 75. Cologuard is a test for colon cancer screening ONLY, it does NOT diagnose other problems so I wouldn't worry about getting this test, and insurance will NOT pay for it. If you still want it, please call Cologuard - see attached flyer. See below.   Breast cancer screening: mammogram recommended at least every other year.   Cervical cancer screening: Pap every 1 to 5 years depending on age and other risk factors. Due 2024.   Lung cancer screening: not needed for non-smokers  Infection screenings . HIV: recommended screening at least once age 12-65,  more often as needed. . Gonorrhea/Chlamydia: screening as needed.  . Hepatitis C: recommended for anyone born [birth year]-1965 (not needed for you) . TB: certain at-risk populations, or depending on work requirements and/or travel history Other . Bone Density Test: recommended for women at age 104           For alternating diarrhea/constipation, most likely this is irritable bowel syndrome. Can continue probiotics/prebiotics, would add fiber supplement like Metamucil or Citrucel, consider adding peppermint oil OTC. See below for foods that can trigger intestinal irritation. If these measures aren't helping, let me know and I can send referral to gastroenterologist. Again, I do NOT recommend getting a Cologuard test for this issue.    2017 UpToDate Characteristics and sources of common FODMAPs  Word that corresponds to letter in acronym Compounds in this category Foods that contain these compounds  F Fermentable  O Oligosaccharides Fructans, galacto-oligosaccharides Wheat, barley, rye, onion, leek, white part of spring onion, garlic, shallots, artichokes, beetroot, fennel, peas, chicory, pistachio, cashews, legumes, lentils, and chickpeas  D Disaccharides Lactose Milk, custard, ice cream, and yogurt  M Monosaccharides "Free fructose" (fructose in excess of glucose) Apples, pears, mangoes, cherries, watermelon, asparagus, sugar snap peas, honey, high-fructose corn syrup  A And  P Polyols Sorbitol, mannitol, maltitol, and xylitol Apples, pears, apricots, cherries, nectarines, peaches, plums, watermelon, mushrooms, cauliflower, artificially sweetened chewing gum and confectionery  FODMAPs: fermentable oligosaccharides, disaccharides, monosaccharides, and polyols. Adapted by permission from [employer]: [employer]. Agustin Cree, [employer], [place]. Short-chain carbohydrates and functional gastrointestinal disorders. Am J Gastroenterol 2013; 108:707. Copyright   2013. www.nature.com/ajg. Graphic  [id number] Version 2.0

## 2018-10-13 LAB — COMPLETE METABOLIC PANEL WITH GFR
AG Ratio: 1.8 (calc) (ref 1.0–2.5)
ALT: 10 U/L (ref 6–29)
AST: 17 U/L (ref 10–30)
Albumin: 4.6 g/dL (ref 3.6–5.1)
Alkaline phosphatase (APISO): 37 U/L (ref 31–125)
BUN: 10 mg/dL (ref 7–25)
CO2: 28 mmol/L (ref 20–32)
Calcium: 9.5 mg/dL (ref 8.6–10.2)
Chloride: 104 mmol/L (ref 98–110)
Creat: 0.67 mg/dL (ref 0.50–1.10)
GFR, Est African American: 126 mL/min/{1.73_m2} (ref >=60)
GFR, Est Non African American: 108 mL/min/{1.73_m2} (ref >=60)
Globulin: 2.6 g/dL (calc) (ref 1.9–3.7)
Glucose, Bld: 87 mg/dL (ref 65–99)
Potassium: 4.1 mmol/L (ref 3.5–5.3)
Sodium: 138 mmol/L (ref 135–146)
Total Bilirubin: 0.6 mg/dL (ref 0.2–1.2)
Total Protein: 7.2 g/dL (ref 6.1–8.1)

## 2018-10-13 LAB — CBC
HCT: 41.2 % (ref 35.0–45.0)
Hemoglobin: 13.6 g/dL (ref 11.7–15.5)
MCH: 31.9 pg (ref 27.0–33.0)
MCHC: 33 g/dL (ref 32.0–36.0)
MCV: 96.7 fL (ref 80.0–100.0)
MPV: 12.2 fL (ref 7.5–12.5)
Platelets: 236 10*3/uL (ref 140–400)
RBC: 4.26 10*6/uL (ref 3.80–5.10)
RDW: 12.3 % (ref 11.0–15.0)
WBC: 6.6 10*3/uL (ref 3.8–10.8)

## 2018-10-13 LAB — GAMMA GT: GGT: 8 U/L (ref 3–55)

## 2018-10-13 LAB — TSH: TSH: 3.7 mIU/L

## 2018-10-13 LAB — LIPID PANEL
Cholesterol: 184 mg/dL (ref <200)
HDL: 70 mg/dL (ref >=50)
LDL Cholesterol (Calc): 99 mg/dL (calc)
Non-HDL Cholesterol (Calc): 114 mg/dL (calc) (ref <130)
Total CHOL/HDL Ratio: 2.6 (calc) (ref <5.0)
Triglycerides: 68 mg/dL (ref <150)

## 2018-10-13 LAB — AMYLASE: Amylase: 46 U/L (ref 21–101)

## 2018-10-13 LAB — LIPASE: Lipase: 26 U/L (ref 7–60)

## 2018-10-25 ENCOUNTER — Encounter: Payer: Self-pay | Admitting: Osteopathic Medicine

## 2018-12-01 ENCOUNTER — Encounter: Payer: Self-pay | Admitting: Osteopathic Medicine

## 2018-12-01 DIAGNOSIS — F410 Panic disorder [episodic paroxysmal anxiety] without agoraphobia: Secondary | ICD-10-CM

## 2018-12-02 MED ORDER — VALACYCLOVIR HCL 1 G PO TABS: 1000.0000 mg | ORAL_TABLET | Freq: Three times a day (TID) | ORAL | 2 refills | Status: DC

## 2018-12-02 MED ORDER — LORAZEPAM 0.5 MG PO TABS: 0.5000 mg | ORAL_TABLET | Freq: Two times a day (BID) | ORAL | 0 refills | Status: DC | PRN

## 2019-03-01 ENCOUNTER — Encounter: Payer: Self-pay | Admitting: Osteopathic Medicine

## 2019-03-01 DIAGNOSIS — K581 Irritable bowel syndrome with constipation: Secondary | ICD-10-CM

## 2019-03-03 ENCOUNTER — Encounter: Payer: Self-pay | Admitting: Gastroenterology

## 2019-03-09 ENCOUNTER — Other Ambulatory Visit: Payer: Self-pay | Admitting: Osteopathic Medicine

## 2019-03-09 DIAGNOSIS — F419 Anxiety disorder, unspecified: Secondary | ICD-10-CM

## 2019-04-03 NOTE — Progress Notes (Signed)
Referring Provider: Emeterio Reeve, DO Primary Care Physician:  Emeterio Reeve, DO  Reason for Consultation: Irritable bowel syndrome with constipation   IMPRESSION:  Change in bowel habits Alternating diarrhea and constipation Gas and bloating Mucous in the stools Unintentional weight gain History of constipation-predominant IBS Fatty liver on his CT angiogram 12/15/2016    - right upper quadrant ultrasound 01/05/2017 showed a normal liver echotexture    - labs 10/12/18 normal liver enzymes  Mardene Sayer suspected  irritable bowel syndrome. At this time will check for IBS masqueraders including celiac disease, IBD, food intolerance (lactose, fructose, sucrose), SIBO, thyroid disorder. Food diary recommended. Trial of Benefiber in the meantime.   CTA from 2018 showed fatty liver. Subsequent ultrasound and liver enzymes and ultrasound have been normal. Suggest follow-up ultrasound.     PLAN: Trial of Benefiber daily between now and endoscopy instructions Food diary ESR, CRP, fecal calprotectin, GI pathogen panel Abdominal ultrasound at her convenience Colonoscopy EGD with duodenal biopsies  Please see the "Patient Instructions" section for addition details about the plan.  HPI: Amber Case is a 44 y.o. female who was referred by Dr. Sheppard Coil.  The history is obtained through the patient and review of her electronic health record.  She has anxiety and a history of gestational diabetes. She is an Optometrist.   She carries a diagnosis of constipation predominant IBS. More recently she is experiencing alternating between constipation and diarrhea over the last year. Symptoms predate Covid.  She will have several days of 1 and then switch to the other.  Associated post-prandial bloating, nausea, and early satiety. Associated bloating and distension ("tight") that has become more bothersome over the last 4-5 months. Symptoms improve with defecation.  She has associated  lower abdominal cramping that is improved with defecation. No blood in stool.  Frequent mucous, sometimes without stool.  No change in symptoms with prior attempts to avoid sodas, drinking more water, and following a vegan diet. Taking a daily fiber supplement - SparkleFiber. Previously on Metamucil but could not tolerate it because of the taste.   Had a colonoscopy with Dr. Dorrene German in 2006 for mucus in the stool that was normal.  Lipase 26.  Amylase 46.  GGT 8.  Incidentally found to have a fatty liver on his CT angiogram 12/15/2016.  A follow-up right upper quadrant ultrasound 01/05/2017 showed a normal liver echotexture.  She has had normal liver enzymes in the past  Labs from 10/12/2018 show a normal comprehensive metabolic panel including normal liver enzymes.  On iron for anemia but she feels very fatigued.  She has gained weight over the last.  She's worried that something is different.  No known family history of colon cancer or polyps. No family history of uterine/endometrial cancer, pancreatic cancer or gastric/stomach cancer.   Past Medical History:  Diagnosis Date  . Abdominal hernia   . Anxiety   . Gestational diabetes    First pregnancy  . H. pylori infection   . History of abnormal cervical Pap smear   . Irritable bowel syndrome   . Palpitations   . SVT (supraventricular tachycardia) ([facility])     Past Surgical History:  Procedure Laterality Date  . CESAREAN SECTION    . DOPPLER ECHOCARDIOGRAPHY  05/24/2009   trace pulmonary regurgitation.  trace tricuspid regurgitation. LV normal.  Trace mitral regurgitation.    . heart monitor     NO RESULT READ  . nuclear testing  08/30/2009   NM MYOCAR MULTI w/SPECT;  Mild septal hypokinesis.  EF 63%    Current Outpatient Medications  Medication Sig Dispense Refill  . LORazepam (ATIVAN) 0.5 MG tablet Take 1 tablet (0.5 mg total) by mouth 2 (two) times daily as needed (severe anxiety/panic). 15 tablet 0  . sertraline (ZOLOFT) 50  MG tablet TAKE 1 TABLET BY MOUTH EVERY DAY 90 tablet 0  . valACYclovir (VALTREX) 1000 MG tablet Take 1 tablet (1,000 mg total) by mouth 3 (three) times daily. For 10 days as needed for outbreak 30 tablet 2   No current facility-administered medications for this visit.    Allergies as of 04/04/2019 - Review Complete 04/04/2019  Allergen Reaction Noted  . Sulfa antibiotics Hives 01/30/2011  . Sulfonamide derivatives      Family History  Problem Relation Age of Onset  . Heart murmur Mother   . Colitis Mother   . Irritable bowel syndrome Mother   . High blood pressure Brother   . Irritable bowel syndrome Brother   . Heart attack Maternal Grandmother   . Heart disease Maternal Grandmother   . Prostate cancer Paternal Grandfather   . Diabetes Maternal Aunt   . Heart disease Maternal Aunt   . Diabetes Maternal Uncle   . Diabetes Paternal Aunt   . Diabetes Paternal Uncle   . Stomach cancer Maternal Grandfather     Social History   Socioeconomic History  . Marital status: Married    Spouse name: Not on file  . Number of children: 3  . Years of education: Not on file  . Highest education level: Not on file  Occupational History  . Occupation: homemaker  Tobacco Use  . Smoking status: Never Smoker  . Smokeless tobacco: Never Used  Substance and Sexual Activity  . Alcohol use: No  . Drug use: No  . Sexual activity: Yes    Partners: Male    Birth control/protection: Condom  Other Topics Concern  . Not on file  Social History Narrative   Her daughter has a chromosome 15 defect   Social Determinants of Health   Financial Resource Strain:   . Difficulty of Paying Living Expenses: Not on file  Food Insecurity:   . Worried About Charity fundraiser in the Last Year: Not on file  . Ran Out of Food in the Last Year: Not on file  Transportation Needs:   . Lack of Transportation (Medical): Not on file  . Lack of Transportation (Non-Medical): Not on file  Physical Activity:     . Days of Exercise per Week: Not on file  . Minutes of Exercise per Session: Not on file  Stress:   . Feeling of Stress : Not on file  Social Connections:   . Frequency of Communication with Friends and Family: Not on file  . Frequency of Social Gatherings with Friends and Family: Not on file  . Attends Religious Services: Not on file  . Active Member of Clubs or Organizations: Not on file  . Attends Archivist Meetings: Not on file  . Marital Status: Not on file  Intimate Partner Violence:   . Fear of Current or Ex-Partner: Not on file  . Emotionally Abused: Not on file  . Physically Abused: Not on file  . Sexually Abused: Not on file    Review of Systems: 12 system ROS is negative except as noted above.   Physical Exam: General:   Alert,  well-nourished, pleasant and cooperative in NAD Head:  Normocephalic and atraumatic. Eyes:  Sclera  clear, no icterus.   Conjunctiva pink. Ears:  Normal auditory acuity. Nose:  No deformity, discharge,  or lesions. Mouth:  No deformity or lesions.   Neck:  Supple; no masses or thyromegaly. Lungs:  Clear throughout to auscultation.   No wheezes. Heart:  Regular rate and rhythm; no murmurs. Abdomen:  Soft,nontender, nondistended, normal bowel sounds, no rebound or guarding. No hepatosplenomegaly.   Rectal:  Deferred  Msk:  Symmetrical. No boney deformities LAD: No inguinal or umbilical LAD Extremities:  No clubbing or edema. Neurologic:  Alert and  oriented x4;  grossly nonfocal Skin:  Intact without significant lesions or rashes. Psych:  Alert and cooperative. Normal mood and affect.    Deryn Massengale L. Tarri Glenn, MD, MPH 04/04/2019, 9:32 PM

## 2019-04-04 ENCOUNTER — Other Ambulatory Visit (INDEPENDENT_AMBULATORY_CARE_PROVIDER_SITE_OTHER): Payer: PRIVATE HEALTH INSURANCE

## 2019-04-04 ENCOUNTER — Encounter: Payer: Self-pay | Admitting: Gastroenterology

## 2019-04-04 ENCOUNTER — Ambulatory Visit (INDEPENDENT_AMBULATORY_CARE_PROVIDER_SITE_OTHER): Payer: PRIVATE HEALTH INSURANCE | Admitting: Gastroenterology

## 2019-04-04 VITALS — BP 100/64 | HR 88 | Temp 98.5°F | Ht 64.0 in | Wt 147.2 lb

## 2019-04-04 DIAGNOSIS — R194 Change in bowel habit: Secondary | ICD-10-CM | POA: Diagnosis not present

## 2019-04-04 DIAGNOSIS — R140 Abdominal distension (gaseous): Secondary | ICD-10-CM | POA: Diagnosis not present

## 2019-04-04 DIAGNOSIS — R141 Gas pain: Secondary | ICD-10-CM

## 2019-04-04 LAB — SEDIMENTATION RATE: Sed Rate: 16 mm/hr (ref 0–20)

## 2019-04-04 LAB — C-REACTIVE PROTEIN: CRP: <1 mg/dL (ref 0.5–20.0)

## 2019-04-04 NOTE — Patient Instructions (Signed)
You have been scheduled for an endoscopy and colonoscopy. Please follow the written instructions given to you at your visit today. Please pick up your prep supplies at the pharmacy within the next 1-3 days. If you use inhalers (even only as needed), please bring them with you on the day of your procedure.  Your provider has requested that you go to the basement level for lab work before leaving today. Press "B" on the elevator. The lab is located at the first door on the left as you exit the elevator.

## 2019-04-06 LAB — GASTROINTESTINAL PATHOGEN PANEL PCR
C. difficile Tox A/B, PCR: NOT DETECTED
Campylobacter, PCR: NOT DETECTED
Cryptosporidium, PCR: NOT DETECTED
E coli (ETEC) LT/ST PCR: NOT DETECTED
E coli (STEC) stx1/stx2, PCR: NOT DETECTED
E coli 0157, PCR: NOT DETECTED
Giardia lamblia, PCR: NOT DETECTED
Norovirus, PCR: NOT DETECTED
Rotavirus A, PCR: NOT DETECTED
Salmonella, PCR: NOT DETECTED
Shigella, PCR: NOT DETECTED

## 2019-04-10 LAB — CALPROTECTIN, FECAL: Calprotectin, Fecal: 27 ug/g (ref 0–120)

## 2019-04-11 ENCOUNTER — Other Ambulatory Visit: Payer: Self-pay

## 2019-04-11 ENCOUNTER — Ambulatory Visit (HOSPITAL_COMMUNITY)
Admission: RE | Admit: 2019-04-11 | Discharge: 2019-04-11 | Disposition: A | Discharge status: Home / Self Care | Payer: PRIVATE HEALTH INSURANCE | Source: Ambulatory Visit | Attending: Gastroenterology | Admitting: Gastroenterology

## 2019-04-11 DIAGNOSIS — R141 Gas pain: Secondary | ICD-10-CM | POA: Insufficient documentation

## 2019-04-11 DIAGNOSIS — R140 Abdominal distension (gaseous): Secondary | ICD-10-CM | POA: Insufficient documentation

## 2019-04-11 DIAGNOSIS — R194 Change in bowel habit: Secondary | ICD-10-CM | POA: Diagnosis present

## 2019-04-13 ENCOUNTER — Other Ambulatory Visit: Payer: Self-pay | Admitting: Emergency Medicine

## 2019-04-13 ENCOUNTER — Encounter: Payer: Self-pay | Admitting: *Deleted

## 2019-04-13 MED ORDER — NA SULFATE-K SULFATE-MG SULF 17.5-3.13-1.6 GM/177ML PO SOLN: 1.0000 | ORAL | 0 refills | Status: DC

## 2019-04-17 ENCOUNTER — Ambulatory Visit (INDEPENDENT_AMBULATORY_CARE_PROVIDER_SITE_OTHER): Payer: PRIVATE HEALTH INSURANCE

## 2019-04-17 ENCOUNTER — Other Ambulatory Visit: Payer: Self-pay | Admitting: Gastroenterology

## 2019-04-17 DIAGNOSIS — Z1159 Encounter for screening for other viral diseases: Secondary | ICD-10-CM

## 2019-04-18 LAB — SARS CORONAVIRUS 2 (TAT 6-24 HRS): SARS Coronavirus 2: NEGATIVE

## 2019-04-19 ENCOUNTER — Encounter: Payer: Self-pay | Admitting: Gastroenterology

## 2019-04-19 ENCOUNTER — Ambulatory Visit (AMBULATORY_SURGERY_CENTER): Payer: PRIVATE HEALTH INSURANCE | Admitting: Gastroenterology

## 2019-04-19 ENCOUNTER — Other Ambulatory Visit: Payer: Self-pay

## 2019-04-19 VITALS — BP 105/62 | HR 81 | Temp 97.3°F | Resp 19 | Ht 64.0 in | Wt 147.0 lb

## 2019-04-19 DIAGNOSIS — D122 Benign neoplasm of ascending colon: Secondary | ICD-10-CM | POA: Diagnosis not present

## 2019-04-19 DIAGNOSIS — R141 Gas pain: Secondary | ICD-10-CM

## 2019-04-19 DIAGNOSIS — R197 Diarrhea, unspecified: Secondary | ICD-10-CM | POA: Diagnosis not present

## 2019-04-19 DIAGNOSIS — K5900 Constipation, unspecified: Secondary | ICD-10-CM

## 2019-04-19 DIAGNOSIS — R194 Change in bowel habit: Secondary | ICD-10-CM

## 2019-04-19 DIAGNOSIS — K2950 Unspecified chronic gastritis without bleeding: Secondary | ICD-10-CM

## 2019-04-19 DIAGNOSIS — B9681 Helicobacter pylori [H. pylori] as the cause of diseases classified elsewhere: Secondary | ICD-10-CM | POA: Diagnosis not present

## 2019-04-19 MED ORDER — SODIUM CHLORIDE 0.9 % IV SOLN: 500.0000 mL | Freq: Once | INTRAVENOUS | Status: DC

## 2019-04-19 NOTE — Progress Notes (Signed)
Called to room to assist during endoscopic procedure.  Patient ID and intended procedure confirmed with present staff. Received instructions for my participation in the procedure from the performing physician.  

## 2019-04-19 NOTE — Op Note (Signed)
[facility] Patient Name: Amber Case Procedure Date: 04/19/2019 2:35 PM MRN: [id number] Endoscopist: Thornton Park MD, MD Age: 44 Referring MD:  Date of Birth: 1975-05-19 Gender: Female Account #: [id number] Procedure:                Colonoscopy Indications:              Change in bowel habits                           Alternating diarrhea and constipation                           Gas and bloating                           Mucous in the stools                           Unintentional weight gain                           History of constipation-predominant IBS Medicines:                Monitored Anesthesia Care Procedure:                Pre-Anesthesia Assessment:                           - Prior to the procedure, a History and Physical                            was performed, and patient medications and                            allergies were reviewed. The patient's tolerance of                            previous anesthesia was also reviewed. The risks                            and benefits of the procedure and the sedation                            options and risks were discussed with the patient.                            All questions were answered, and informed consent                            was obtained. Prior Anticoagulants: The patient has                            taken no previous anticoagulant or antiplatelet                            agents. ASA Grade Assessment: II - A patient with  mild systemic disease. After reviewing the risks                            and benefits, the patient was deemed in                            satisfactory condition to undergo the procedure.                           After obtaining informed consent, the colonoscope                            was passed under direct vision. Throughout the                            procedure, the patient's blood pressure, pulse, and         oxygen saturations were monitored continuously. The                            Colonoscope was introduced through the anus and                            advanced to the 4 cm into the ileum. A second                            forward view of the right colon was performed. The                            colonoscopy was performed with moderate difficulty                            due to a redundant colon, significant looping and a                            tortuous colon. Successful completion of the                            procedure was aided by applying abdominal pressure.                            The patient tolerated the procedure well. The                            quality of the bowel preparation was good. The                            terminal ileum, ileocecal valve, appendiceal                            orifice, and rectum were photographed. Scope In: 2:48:03 PM Scope Out: 3:04:48 PM Scope Withdrawal Time: 0 hours 12 minutes 1 second  Total Procedure Duration: 0 hours 16 minutes 45 seconds  Findings:  The perianal and digital rectal examinations were                            normal.                           A 1-2 mm polyp was found in the proximal ascending                            colon. The polyp was flat. The polyp was removed                            with a cold biopsy forceps. Resection and retrieval                            were complete. Estimated blood loss was minimal.                           The colon (entire examined portion) appeared                            normal. Biopsies for histology were taken with a                            cold forceps from the right colon and left colon                            for evaluation of microscopic colitis. Estimated                            blood loss was minimal.                           The exam was otherwise without abnormality on                            direct and retroflexion  views. Complications:            No immediate complications. Estimated blood loss:                            Minimal. Estimated Blood Loss:     Estimated blood loss was minimal. Impression:               - One 1-2 mm polyp in the proximal ascending colon,                            removed with a cold biopsy forceps. Resected and                            retrieved.                           - The entire examined colon is normal. Biopsied.                           -  The examination was otherwise normal on direct                            and retroflexion views. Recommendation:           - Patient has a contact number available for                            emergencies. The signs and symptoms of potential                            delayed complications were discussed with the                            patient. Return to normal activities tomorrow.                            Written discharge instructions were provided to the                            patient.                           - Resume previous diet.                           - Continue present medications.                           - Await pathology results.                           - Repeat colonoscopy date to be determined after                            pending pathology results are reviewed for                            surveillance.                           - Return to GI office to review these results.                           - Emerging evidence supports eating a diet of                            fruits, vegetables, grains, calcium, and yogurt                            while reducing red meat and alcohol may reduce the                            risk of colon cancer. Thornton Park MD, MD 04/19/2019 3:18:43 PM This report has been signed electronically.

## 2019-04-19 NOTE — Progress Notes (Signed)
Pt's states no medical or surgical changes since previsit or office visit.  AER - temp DT - vitals

## 2019-04-19 NOTE — Progress Notes (Signed)
A and O x3. Report to RN. Tolerated MAC anesthesia well.Teeth unchanged after procedure.

## 2019-04-19 NOTE — Op Note (Signed)
[facility] Patient Name: Amber Case Procedure Date: 04/19/2019 2:35 PM MRN: [id number] Endoscopist: Thornton Park MD, MD Age: 44 Referring MD:  Date of Birth: [birth year] Gender: Female Account #: [id number] Procedure:                Upper GI endoscopy Indications:              Abdominal bloating                           Change in bowel habits                           Alternating diarrhea and constipation                           Gas and bloating                           Mucous in the stools                           Unintentional weight gain                           History of constipation-predominant IBS Medicines:                Monitored Anesthesia Care Procedure:                Pre-Anesthesia Assessment:                           - Prior to the procedure, a History and Physical                            was performed, and patient medications and                            allergies were reviewed. The patient's tolerance of                            previous anesthesia was also reviewed. The risks                            and benefits of the procedure and the sedation                            options and risks were discussed with the patient.                            All questions were answered, and informed consent                            was obtained. Prior Anticoagulants: The patient has                            taken no previous anticoagulant or antiplatelet  agents. ASA Grade Assessment: II - A patient with                            mild systemic disease. After reviewing the risks                            and benefits, the patient was deemed in                            satisfactory condition to undergo the procedure.                           After obtaining informed consent, the endoscope was                            passed under direct vision. Throughout the                            procedure, the  patient's blood pressure, pulse, and                            oxygen saturations were monitored continuously. The                            Endoscope was introduced through the mouth, and                            advanced to the third part of duodenum. The upper                            GI endoscopy was accomplished without difficulty.                            The patient tolerated the procedure well. Scope In: Scope Out: Findings:                 The examined esophagus was normal.                           The entire examined stomach was normal. Biopsies                            were taken from the antrum, body, and fundus with a                            cold forceps for histology. Estimated blood loss                            was minimal.                           The examined duodenum was normal. Biopsies were                            taken  with a cold forceps for histology. Estimated                            blood loss was minimal.                           The cardia and gastric fundus were normal on                            retroflexion.                           The exam was otherwise without abnormality. Complications:            No immediate complications. Estimated blood loss:                            Minimal. Estimated Blood Loss:     Estimated blood loss was minimal. Impression:               - Normal esophagus.                           - Normal stomach. Biopsied.                           - Normal examined duodenum. Biopsied.                           - The examination was otherwise normal. Recommendation:           - Patient has a contact number available for                            emergencies. The signs and symptoms of potential                            delayed complications were discussed with the                            patient. Return to normal activities tomorrow.                            Written discharge instructions were provided  to the                            patient.                           - Resume previous diet.                           - Continue present medications.                           - Await pathology results.                           -  Proceed with colonoscopy as previously planned. Thornton Park MD, MD 04/19/2019 3:15:15 PM This report has been signed electronically.

## 2019-04-19 NOTE — Patient Instructions (Signed)
Discharge instructions given. ?Handout on polyps. ?Biopsies taken. ?Resume previous medications. ?YOU HAD AN ENDOSCOPIC PROCEDURE TODAY AT THE [facility]:   Refer to the procedure report that was given to you for any specific questions about what was found during the examination.  If the procedure report does not answer your questions, please call your gastroenterologist to clarify.  If you requested that your care partner not be given the details of your procedure findings, then the procedure report has been included in a sealed envelope for you to review at your convenience later. ? ?YOU SHOULD EXPECT: Some feelings of bloating in the abdomen. Passage of more gas than usual.  Walking can help get rid of the air that was put into your GI tract during the procedure and reduce the bloating. If you had a lower endoscopy (such as a colonoscopy or flexible sigmoidoscopy) you may notice spotting of blood in your stool or on the toilet paper. If you underwent a bowel prep for your procedure, you may not have a normal bowel movement for a few days. ? ?Please Note:  You might notice some irritation and congestion in your nose or some drainage.  This is from the oxygen used during your procedure.  There is no need for concern and it should clear up in a day or so. ? ?SYMPTOMS TO REPORT IMMEDIATELY: ? ?Following lower endoscopy (colonoscopy or flexible sigmoidoscopy): ? Excessive amounts of blood in the stool ? Significant tenderness or worsening of abdominal pains ? Swelling of the abdomen that is new, acute ? Fever of 100?F or higher ? ?Following upper endoscopy (EGD) ? Vomiting of blood or coffee ground material ? New chest pain or pain under the shoulder blades ? Painful or persistently difficult swallowing ? New shortness of breath ? Fever of 100?F or higher ? Black, tarry-looking stools ? ?For urgent or emergent issues, a gastroenterologist can be reached at any hour by calling [phone]. ?Do not use  MyChart messaging for urgent concerns.  ? ? ?DIET:  We do recommend a small meal at first, but then you may proceed to your regular diet.  Drink plenty of fluids but you should avoid alcoholic beverages for 24 hours. ? ?ACTIVITY:  You should plan to take it easy for the rest of today and you should NOT DRIVE or use heavy machinery until tomorrow (because of the sedation medicines used during the test).   ? ?FOLLOW UP: ?Our staff will call the number listed on your records 48-72 hours following your procedure to check on you and address any questions or concerns that you may have regarding the information given to you following your procedure. If we do not reach you, we will leave a message.  We will attempt to reach you two times.  During this call, we will ask if you have developed any symptoms of COVID 19. If you develop any symptoms (ie: fever, flu-like symptoms, shortness of breath, cough etc.) before then, please call [phone].  If you test positive for Covid 19 in the 2 weeks post procedure, please call and report this information to us.   ? ?If any biopsies were taken you will be contacted by phone or by letter within the next 1-3 weeks.  Please call us at [phone] if you have not heard about the biopsies in 3 weeks.  ? ? ?SIGNATURES/CONFIDENTIALITY: ?You and/or your care partner have signed paperwork which will be entered into your electronic medical record.  These signatures attest   signatures attest to the fact that that the information above on your After Visit Summary has been reviewed and is understood.  Full responsibility of the confidentiality of this discharge information lies with you and/or your care-partner. 

## 2019-04-20 ENCOUNTER — Encounter: Payer: Self-pay | Admitting: *Deleted

## 2019-04-21 ENCOUNTER — Telehealth: Payer: Self-pay | Admitting: *Deleted

## 2019-04-21 ENCOUNTER — Telehealth: Payer: Self-pay

## 2019-04-21 NOTE — Telephone Encounter (Signed)
  Follow up Call-  Call back number 04/19/2019  Post procedure Call Back phone  # [phone]  Permission to leave phone message Yes  Some recent data might be hidden     Patient questions:  Message left to call us if necessary.  Second call.

## 2019-04-21 NOTE — Telephone Encounter (Signed)
No answer, left message to call back later today, B.Azaleah Usman RN. 

## 2019-04-27 ENCOUNTER — Ambulatory Visit: Payer: PRIVATE HEALTH INSURANCE

## 2019-04-27 ENCOUNTER — Other Ambulatory Visit: Payer: Self-pay

## 2019-04-27 DIAGNOSIS — B9681 Helicobacter pylori [H. pylori] as the cause of diseases classified elsewhere: Secondary | ICD-10-CM

## 2019-04-27 MED ORDER — METRONIDAZOLE 500 MG PO TABS: 500.0000 mg | ORAL_TABLET | Freq: Four times a day (QID) | ORAL | 0 refills | Status: DC

## 2019-04-27 MED ORDER — TETRACYCLINE HCL 500 MG PO CAPS: 500.0000 mg | ORAL_CAPSULE | Freq: Four times a day (QID) | ORAL | 0 refills | Status: DC

## 2019-04-27 MED ORDER — PANTOPRAZOLE SODIUM 40 MG PO TBEC: 40.0000 mg | DELAYED_RELEASE_TABLET | Freq: Two times a day (BID) | ORAL | 0 refills | Status: DC

## 2019-04-27 MED ORDER — BISMUTH SUBSALICYLATE 262 MG PO TABS: 262.0000 mg | ORAL_TABLET | Freq: Four times a day (QID) | ORAL | 0 refills | Status: DC

## 2019-05-23 ENCOUNTER — Ambulatory Visit (INDEPENDENT_AMBULATORY_CARE_PROVIDER_SITE_OTHER): Payer: PRIVATE HEALTH INSURANCE | Admitting: Gastroenterology

## 2019-05-23 ENCOUNTER — Encounter: Payer: Self-pay | Admitting: Gastroenterology

## 2019-05-23 VITALS — BP 106/80 | HR 60 | Temp 98.5°F | Ht 64.0 in | Wt 149.6 lb

## 2019-05-23 DIAGNOSIS — K2970 Gastritis, unspecified, without bleeding: Secondary | ICD-10-CM | POA: Diagnosis not present

## 2019-05-23 DIAGNOSIS — R194 Change in bowel habit: Secondary | ICD-10-CM | POA: Diagnosis not present

## 2019-05-23 DIAGNOSIS — B9681 Helicobacter pylori [H. pylori] as the cause of diseases classified elsewhere: Secondary | ICD-10-CM | POA: Diagnosis not present

## 2019-05-23 DIAGNOSIS — R140 Abdominal distension (gaseous): Secondary | ICD-10-CM

## 2019-05-23 NOTE — Progress Notes (Signed)
Referring Provider: Emeterio Reeve, DO Primary Care Physician:  Emeterio Reeve, DO  Chief complaint: Irritable bowel syndrome with constipation   IMPRESSION:  H pylori gastritis    - initially diagnosed and treated 15 years ago (details not available)    - completed bismuth subsalicylate, metronidazole, tetracycline, pantoprazole last week Change in bowel habits, gas, bloating   - Labs 04/05/2019: Fecal calprotectin 27, negative GI pathogen panel, CRP less than 1, ESR 16    - normal duodenal and random colon biopsies History of colon polyp    - tubular adenoma on colonoscopy 04/19/19 Mucous in the stools since 2006 History of constipation-predominant IBS Fatty liver on his CT angiogram 12/15/2016    - right upper quadrant ultrasound 01/05/2017 showed a normal liver echotexture    - labs 10/12/18 normal liver enzymes    - normal ultrasound 04/11/19  Suspected irritable bowel syndrome, although found to have H pylori having just completed treatment last week. Will have to see if symptoms improve with resolution of H pylori.   Experiencing some altered bowel habits after completing quadruple therapy for H pylori. Recommend trial of Benefiber in addition to her daily probiotics. If symptoms persist, consider trial of treatment for SIBO.  CTA from 2018 showed fatty liver. Subsequent ultrasound and liver enzymes have been normal. No further follow-up recommended at this time.   History of colon polyps: Tubular adenoma removed 03/2019. Surveillance colonoscopy recommended in 7 years.    PLAN: Continue Benefiber twice daily Continue daily probiotics H pylori antigen testing in 6-8 weeks (she is off pantoprazole) Office visit after follow-up H pylori testing Consider trial of Xifaxan 550 mg TID x 14 days if no improvement in bowel habits Surveillance colonoscopy in 7 years  Please see the "Patient Instructions" section for addition details about the plan.  HPI: Amber Case  is a 44 y.o. female initially seen in consultation 04/04/19. She returns in scheduled follow-up after endoscopic evaluation last month.  The interval history is obtained through the patient and review of her electronic health record.  She has anxiety and a history of gestational diabetes. She carries a diagnosis of constipation- IBS and was previously evaluated by Dr. Dorrene German with a colonoscopy in 2006. She is an Optometrist.   Today she reveals a prior history of H pylori diagnosed prior to her pregnancy with her 43 year old.  Unable to prescribe standard therapy given her pregnancy. Alternative treatment was pursued and follow-up testing was negative.  At the time of her consultation 04/04/19 she was experiencing one year of alternating constipation and diarrhea with associated post-prandial bloating, nausea, and early satiety. Abdominal distension ("tight") that has become more bothersome over the last 4-5 months. Symptoms improve with defecation.  She has associated lower abdominal cramping that is also improved with defecation. Frequent mucous, sometimes without stool. No bleeding. No change in symptoms with prior attempts to avoid sodas, drinking more water, and following a vegan diet. Taking a daily fiber supplement - SparkleFiber. Previously on Metamucil but could not tolerate it because of the taste.   Labs 04/05/2019: Fecal calprotectin 27, negative GI pathogen panel, CRP less than 1, ESR 16 Abdominal ultrasound 04/11/2019 normal  Colonoscopy 04/19/2019 showed a small ascending colon adenoma but was otherwise normal.  Upper endoscopy at the same time was essentially normal but gastric biopsy showed H. pylori gastritis.  Treatment with bismuth subsalicylate 546 mg 4 times daily, metronidazole 500 mg 4 times daily, tetracycline 500 mg 4 times daily, and pantoprazole  40 mg twice daily was recommended for 14 days..  Surveillance colonoscopy recommended in 7 years.  Stool changed on antibiotics. Stools are  now snakey, more frequent with BM 2-3 times daily. Texture has changed.  Continues to have some mucous, no blood. No voluminous or malodorous stool.  Completed antibiotics last week.  Started probiotics to try to improve her symptoms. Unclear if her presenting symptoms have improved at this time.   At the time of her initial consultation, her history revealed an incidentally found to have a fatty liver on his CT angiogram 12/15/2016.  A follow-up right upper quadrant ultrasound 01/05/2017 showed a normal liver echotexture.  She has had normal liver enzymes in the past  Labs from 10/12/2018 show a normal comprehensive metabolic panel including normal liver enzymes.  On iron for anemia with reported ongoing fatigue.  No anemia on labs from 10/12/18.    Past Medical History:  Diagnosis Date  . Abdominal hernia   . Anxiety   . Gestational diabetes    First pregnancy  . H. pylori infection   . History of abnormal cervical Pap smear   . Irritable bowel syndrome   . Palpitations   . SVT (supraventricular tachycardia) (HCC)     Past Surgical History:  Procedure Laterality Date  . CESAREAN SECTION    . DOPPLER ECHOCARDIOGRAPHY  05/24/2009   trace pulmonary regurgitation.  trace tricuspid regurgitation. LV normal.  Trace mitral regurgitation.    . heart monitor     NO RESULT READ  . nuclear testing  08/30/2009   NM MYOCAR MULTI w/SPECT; Mild septal hypokinesis.  EF 63%    Current Outpatient Medications  Medication Sig Dispense Refill  . Bismuth Subsalicylate 626 MG TABS Take 1 tablet (262 mg total) by mouth 4 (four) times daily. 56 tablet 0  . LORazepam (ATIVAN) 0.5 MG tablet Take 1 tablet (0.5 mg total) by mouth 2 (two) times daily as needed (severe anxiety/panic). 15 tablet 0  . metroNIDAZOLE (FLAGYL) 500 MG tablet Take 1 tablet (500 mg total) by mouth 4 (four) times daily. 56 tablet 0  . pantoprazole (PROTONIX) 40 MG tablet Take 1 tablet (40 mg total) by mouth 2 (two) times daily. 28  tablet 0  . sertraline (ZOLOFT) 50 MG tablet TAKE 1 TABLET BY MOUTH EVERY DAY 90 tablet 0  . tetracycline (SUMYCIN) 500 MG capsule Take 1 capsule (500 mg total) by mouth 4 (four) times daily. 56 capsule 0  . valACYclovir (VALTREX) 1000 MG tablet Take 1 tablet (1,000 mg total) by mouth 3 (three) times daily. For 10 days as needed for outbreak 30 tablet 2   No current facility-administered medications for this visit.    Allergies as of 05/23/2019 - Review Complete 05/23/2019  Allergen Reaction Noted  . Sulfa antibiotics Hives 01/30/2011  . Sulfonamide derivatives      Family History  Problem Relation Age of Onset  . Heart murmur Mother   . Colitis Mother   . Irritable bowel syndrome Mother   . High blood pressure Brother   . Irritable bowel syndrome Brother   . Heart attack Maternal Grandmother   . Heart disease Maternal Grandmother   . Prostate cancer Paternal Grandfather   . Diabetes Maternal Aunt   . Heart disease Maternal Aunt   . Diabetes Maternal Uncle   . Diabetes Paternal Aunt   . Diabetes Paternal Uncle   . Stomach cancer Maternal Grandfather     Social History   Socioeconomic History  . Marital  status: Married    Spouse name: Not on file  . Number of children: 3  . Years of education: Not on file  . Highest education level: Not on file  Occupational History  . Occupation: homemaker  Tobacco Use  . Smoking status: Never Smoker  . Smokeless tobacco: Never Used  Substance and Sexual Activity  . Alcohol use: No  . Drug use: No  . Sexual activity: Yes    Partners: Male    Birth control/protection: Condom  Other Topics Concern  . Not on file  Social History Narrative   Her daughter has a chromosome 15 defect   Social Determinants of Health   Financial Resource Strain:   . Difficulty of Paying Living Expenses:   Food Insecurity:   . Worried About Charity fundraiser in the Last Year:   . Arboriculturist in the Last Year:   Transportation Needs:   .  Film/video editor (Medical):   Marland Kitchen Lack of Transportation (Non-Medical):   Physical Activity:   . Days of Exercise per Week:   . Minutes of Exercise per Session:   Stress:   . Feeling of Stress :   Social Connections:   . Frequency of Communication with Friends and Family:   . Frequency of Social Gatherings with Friends and Family:   . Attends Religious Services:   . Active Member of Clubs or Organizations:   . Attends Archivist Meetings:   Marland Kitchen Marital Status:   Intimate Partner Violence:   . Fear of Current or Ex-Partner:   . Emotionally Abused:   Marland Kitchen Physically Abused:   . Sexually Abused:     Physical Exam: General:   Alert,  well-nourished, pleasant and cooperative in NAD Head:  Normocephalic and atraumatic. Eyes:  Sclera clear, no icterus.   Conjunctiva pink. Ears:  Normal auditory acuity. Abdomen:  Soft, nontender, nondistended, normal bowel sounds, no rebound or guarding. No hepatosplenomegaly.   Rectal:  Deferred  Msk:  Symmetrical. No boney deformities LAD: No inguinal or umbilical LAD Extremities:  No clubbing or edema. Neurologic:  Alert and  oriented x4;  grossly nonfocal Skin:  Intact without significant lesions or rashes. Psych:  Alert and cooperative. Normal mood and affect.    Wynter Grave L. Tarri Glenn, MD, MPH 05/23/2019, 10:01 AM

## 2019-05-23 NOTE — Patient Instructions (Addendum)
Continue to take probiotics daily and Benefiber once or twice daily in an effort to stabilize your stool consistency.  We will plan to obtain testing to confirm eradication of H. pylori in 6 to 8 weeks. Your provider has requested that you go to the basement level for lab work on July 02, 2019 Press "B" on the elevator. The lab is located at the first door on the left as you exit the elevator.  I had like to see back in the office after those test results are available.  Please call me with any questions or concerns prior to that time.  Thank you for trusting me with your gastrointestinal care!    Thornton Park, MD, MPH  If you are age 58 or older, your body mass index should be between 23-30. Your Body mass index is 25.68 kg/m. If this is out of the aforementioned range listed, please consider follow up with your Primary Care Provider.  If you are age 71 or younger, your body mass index should be between 19-25. Your Body mass index is 25.68 kg/m. If this is out of the aformentioned range listed, please consider follow up with your Primary Care Provider.

## 2019-06-27 ENCOUNTER — Other Ambulatory Visit: Payer: Self-pay | Admitting: Osteopathic Medicine

## 2019-06-27 DIAGNOSIS — F419 Anxiety disorder, unspecified: Secondary | ICD-10-CM

## 2019-07-28 ENCOUNTER — Other Ambulatory Visit: Payer: PRIVATE HEALTH INSURANCE

## 2019-07-28 DIAGNOSIS — B9681 Helicobacter pylori [H. pylori] as the cause of diseases classified elsewhere: Secondary | ICD-10-CM

## 2019-07-30 ENCOUNTER — Other Ambulatory Visit: Payer: Self-pay | Admitting: Nurse Practitioner

## 2019-07-30 DIAGNOSIS — F419 Anxiety disorder, unspecified: Secondary | ICD-10-CM

## 2019-07-31 LAB — HELICOBACTER PYLORI  SPECIAL ANTIGEN
MICRO NUMBER:: 10663281
SPECIMEN QUALITY: ADEQUATE

## 2019-08-12 ENCOUNTER — Other Ambulatory Visit: Payer: Self-pay | Admitting: Osteopathic Medicine

## 2019-08-12 DIAGNOSIS — F410 Panic disorder [episodic paroxysmal anxiety] without agoraphobia: Secondary | ICD-10-CM

## 2019-09-22 ENCOUNTER — Ambulatory Visit: Payer: PRIVATE HEALTH INSURANCE | Admitting: Gastroenterology

## 2019-11-03 ENCOUNTER — Encounter: Payer: Self-pay | Admitting: *Deleted

## 2019-11-06 ENCOUNTER — Other Ambulatory Visit (INDEPENDENT_AMBULATORY_CARE_PROVIDER_SITE_OTHER): Payer: PRIVATE HEALTH INSURANCE

## 2019-11-06 ENCOUNTER — Encounter: Payer: Self-pay | Admitting: Gastroenterology

## 2019-11-06 ENCOUNTER — Ambulatory Visit (INDEPENDENT_AMBULATORY_CARE_PROVIDER_SITE_OTHER): Payer: PRIVATE HEALTH INSURANCE | Admitting: Gastroenterology

## 2019-11-06 VITALS — BP 96/62 | HR 88 | Ht 64.0 in | Wt 153.0 lb

## 2019-11-06 DIAGNOSIS — R635 Abnormal weight gain: Secondary | ICD-10-CM | POA: Diagnosis not present

## 2019-11-06 DIAGNOSIS — Z7689 Persons encountering health services in other specified circumstances: Secondary | ICD-10-CM

## 2019-11-06 NOTE — Progress Notes (Signed)
Referring Provider: Emeterio Reeve, DO Primary Care Physician:  Emeterio Reeve, DO  Chief complaint: Irritable bowel syndrome with constipation   IMPRESSION:  Unintentional weight gain  H pylori gastritis    - initially diagnosed and treated 15 years ago (details not available)    - diagnosed with H pylori on gastric biopsies 04/19/19    - completed bismuth subsalicylate, metronidazole, tetracycline, pantoprazole    - H pylori stool Antigen negative 07/28/19 Change in bowel habits, gas, bloating   - Labs 04/05/2019: Fecal calprotectin 27, negative GI pathogen panel, CRP less than 1, ESR 16    - normal duodenal and random colon biopsies History of colon polyp    - tubular adenoma on colonoscopy 04/19/19    - surveillance colonoscopy in 7 years Mucous in the stools since 2006 History of constipation-predominant IBS Fatty liver on his CT angiogram 12/15/2016    - right upper quadrant ultrasound 01/05/2017 showed a normal liver echotexture    - labs 10/12/18 normal liver enzymes    - normal ultrasound 04/11/19  GI symptoms: Suspected irritable bowel syndrome, although symptoms have improved with H pylori treatment. Resume Benefiber and probiotics. Consider a trial of Xifaxan with persistent symptoms.   Unexplained weight gain: No obvious source identified on endoscopy or CTA. Will screen for thyroid dysfunction.  CTA from 2018 showed fatty liver. Subsequent ultrasound and liver enzymes have been normal. No further follow-up recommended at this time.   History of colon polyps: Tubular adenoma removed 03/2019. Surveillance colonoscopy recommended in 7 years.    PLAN: TSH given the unexplained weight gain Resume Benefiber twice daily Resume daily probiotics Consider trial of Xifaxan 550 mg TID x 14 days if no improvement in bowel habits Surveillance colonoscopy in 7 years  Please see the "Patient Instructions" section for addition details about the plan.  HPI: Amber Case  is a 44 y.o. female initially seen in consultation 04/04/19. She returns in scheduled follow-up after endoscopic evaluation last month.  The interval history is obtained through the patient and review of her electronic health record.  She has anxiety and a history of gestational diabetes. She carries a diagnosis of constipation-IBS and was previously evaluated by Dr. Dorrene German with a colonoscopy in 2006. She is an Optometrist.   Today she reveals a prior history of H pylori diagnosed prior to her pregnancy with her 43 year old.  Unable to prescribe standard therapy given her pregnancy. Alternative treatment was pursued and follow-up testing was negative.  At the time of her consultation 04/04/19 she was experiencing one year of alternating constipation and diarrhea with associated post-prandial bloating, nausea, and early satiety. Abdominal distension ("tight") that has become more bothersome over the last 4-5 months. Symptoms improve with defecation.  She has associated lower abdominal cramping that is also improved with defecation. Frequent mucous, sometimes without stool. No bleeding. No change in symptoms with prior attempts to avoid sodas, drinking more water, and following a vegan diet. Taking a daily fiber supplement - SparkleFiber. Previously on Metamucil but could not tolerate it because of the taste.   Labs 10/12/18: Normal CMP, CBC, TSH, lipase, amylase, GGTP Labs 04/05/2019: Fecal calprotectin 27, negative GI pathogen panel, CRP less than 1, ESR 16 Abdominal ultrasound 04/11/2019 normal  Colonoscopy 04/19/2019 showed a small ascending colon adenoma but was otherwise normal.  Upper endoscopy at the same time was essentially normal but gastric biopsy showed H. pylori gastritis.  Treatment with bismuth subsalicylate 888 mg 4 times daily, metronidazole 500  mg 4 times daily, tetracycline 500 mg 4 times daily, and pantoprazole 40 mg twice daily was recommended for 14 days..  Surveillance colonoscopy recommended  in 7 years. H pylori stool antigen negative 07/28/19.   At the time of her follow-up visit 05/23/19 she reported that her stool changed on antibiotics. Stools were snakey, more frequent with BM 2-3 times daily. Texture had changed.  Continued to have some mucous, no blood. No voluminous or malodorous stool.    She returns in scheduled follow-up.  Frustrated by unintentional weight gain. Notes that she eats out several times a week.   Noted increasing, unintentional weight gain. Feels like 110-120 is baseline prior to kids 103 after kids 2018 at [facility] at 130 pounds 04/04/19 147  05/23/19  149  Symptoms have returned to normal after completing antibiotics. Ongoing constipation. Knows she is not taking her fiber.   She wonders if stress could be related to her GI symptoms.   At the time of her initial consultation, her history revealed an incidentally found to have a fatty liver on his CT angiogram 12/15/2016.  A follow-up right upper quadrant ultrasound 01/05/2017 showed a normal liver echotexture.  She has had normal liver enzymes in the past  Labs from 10/12/2018 show a normal comprehensive metabolic panel including normal liver enzymes.  On iron for anemia with reported ongoing fatigue.  No anemia on labs from 10/12/18.   Noted increasing symptoms with stress.   Past Medical History:  Diagnosis Date   Abdominal hernia    Anxiety    Gestational diabetes    First pregnancy   H. pylori infection    History of abnormal cervical Pap smear    Irritable bowel syndrome    Palpitations    SVT (supraventricular tachycardia) (HCC)    Tubular adenoma of colon     Past Surgical History:  Procedure Laterality Date   CESAREAN SECTION     DOPPLER ECHOCARDIOGRAPHY  05/24/2009   trace pulmonary regurgitation.  trace tricuspid regurgitation. LV normal.  Trace mitral regurgitation.     heart monitor     NO RESULT READ   nuclear testing  08/30/2009   NM MYOCAR MULTI w/SPECT; Mild septal  hypokinesis.  EF 63%    Current Outpatient Medications  Medication Sig Dispense Refill   LORazepam (ATIVAN) 0.5 MG tablet Take 1 tablet (0.5 mg total) by mouth 2 (two) times daily as needed (severe anxiety/panic). 15 tablet 0   Multiple Vitamin (MULTIVITAMIN) tablet Take 1 tablet by mouth daily.     sertraline (ZOLOFT) 50 MG tablet TAKE 1 TABLET (50 MG TOTAL) BY MOUTH DAILY. PLEASE SCHEDULE APPOINTMENT FOR FUTURE REFILLS. 90 tablet 1   valACYclovir (VALTREX) 1000 MG tablet TAKE 1 TABLET (1,000 MG TOTAL) BY MOUTH 3 (THREE) TIMES DAILY. FOR 10 DAYS AS NEEDED FOR OUTBREAK 30 tablet 2   No current facility-administered medications for this visit.    Allergies as of 11/06/2019 - Review Complete 11/06/2019  Allergen Reaction Noted   Sulfa antibiotics Hives 01/30/2011   Sulfonamide derivatives      Family History  Problem Relation Age of Onset   Heart murmur Mother    Colitis Mother    Irritable bowel syndrome Mother    High blood pressure Brother    Irritable bowel syndrome Brother    Heart attack Maternal Grandmother    Heart disease Maternal Grandmother    Prostate cancer Paternal Grandfather    Diabetes Maternal Aunt    Heart disease Maternal Aunt  Diabetes Maternal Uncle    Diabetes Paternal Aunt    Diabetes Paternal Uncle    Stomach cancer Maternal Grandfather     Social History   Socioeconomic History   Marital status: Married    Spouse name: Not on file   Number of children: 3   Years of education: Not on file   Highest education level: Not on file  Occupational History   Occupation: homemaker  Tobacco Use   Smoking status: Never Smoker   Smokeless tobacco: Never Used  Scientific laboratory technician Use: Never used  Substance and Sexual Activity   Alcohol use: No   Drug use: No   Sexual activity: Yes    Partners: Male    Birth control/protection: Condom  Other Topics Concern   Not on file  Social History Narrative   Her daughter has  a chromosome 15 defect   Social Determinants of Radio broadcast assistant Strain:    Difficulty of Paying Living Expenses: Not on file  Food Insecurity:    Worried About Charity fundraiser in the Last Year: Not on file   [employer] of Food in the Last Year: Not on file  Transportation Needs:    Lack of Transportation (Medical): Not on file   Lack of Transportation (Non-Medical): Not on file  Physical Activity:    Days of Exercise per Week: Not on file   Minutes of Exercise per Session: Not on file  Stress:    Feeling of Stress : Not on file  Social Connections:    Frequency of Communication with Friends and Family: Not on file   Frequency of Social Gatherings with Friends and Family: Not on file   Attends Religious Services: Not on file   Active Member of Clubs or Organizations: Not on file   Attends Archivist Meetings: Not on file   Marital Status: Not on file  Intimate Partner Violence:    Fear of Current or Ex-Partner: Not on file   Emotionally Abused: Not on file   Physically Abused: Not on file   Sexually Abused: Not on file    Physical Exam: General:   Alert,  well-nourished, pleasant and cooperative in NAD Head:  Normocephalic and atraumatic. Eyes:  Sclera clear, no icterus.   Conjunctiva pink. Ears:  Normal auditory acuity. Abdomen:  Soft, nontender, nondistended, normal bowel sounds, no rebound or guarding. No hepatosplenomegaly.   Rectal:  Deferred  Msk:  Symmetrical. No boney deformities LAD: No inguinal or umbilical LAD Extremities:  No clubbing or edema. Neurologic:  Alert and  oriented x4;  grossly nonfocal Skin:  Intact without significant lesions or rashes. Psych:  Alert and cooperative. Normal mood and affect.    Loyed Wilmes L. Tarri Glenn, MD, MPH 11/12/2019, 7:47 PM

## 2019-11-06 NOTE — Patient Instructions (Addendum)
Your provider has requested that you go to the basement level for lab work before leaving today. Press "B" on the elevator. The lab is located at the first door on the left as you exit the elevator. ____________________________________________________________  We have sent a referral to Dr Einar Pheasant. If you do not hear from their office within the next week, please call their office. Address: [address], [place], [place] [address] Phone: [phone] __________________________________________________________  If you are age 2 or younger, your body mass index should be between 19-25. Your Body mass index is 26.26 kg/m. If this is out of the aformentioned range listed, please consider follow up with your Primary Care Provider.   ____________________________________________________________  Due to recent changes in healthcare laws, you may see the results of your imaging and laboratory studies on MyChart before your provider has had a chance to review them.  We understand that in some cases there may be results that are confusing or concerning to you. Not all laboratory results come back in the same time frame and the provider may be waiting for multiple results in order to interpret others.  Please give [place] 48 hours in order for your provider to thoroughly review all the results before contacting the office for clarification of your results.

## 2019-11-07 ENCOUNTER — Telehealth: Payer: Self-pay

## 2019-11-07 LAB — TSH: TSH: 3.44 u[IU]/mL (ref 0.35–4.50)

## 2019-11-07 NOTE — Telephone Encounter (Signed)
Patient called back, I gave her TSH lab results

## 2019-11-07 NOTE — Telephone Encounter (Signed)
-----   Message from Thornton Park, MD sent at 11/07/2019  7:56 AM EDT ----- Please let the patient know that her TSH was normal. No new recommendations at this time.

## 2019-11-07 NOTE — Telephone Encounter (Signed)
See telephone result note

## 2019-11-07 NOTE — Telephone Encounter (Signed)
Lm for patient to call back. Trying to give lab results

## 2019-11-12 ENCOUNTER — Encounter: Payer: Self-pay | Admitting: Gastroenterology

## 2020-02-02 ENCOUNTER — Other Ambulatory Visit: Payer: Self-pay | Admitting: Nurse Practitioner

## 2020-02-02 DIAGNOSIS — F419 Anxiety disorder, unspecified: Secondary | ICD-10-CM

## 2020-03-05 ENCOUNTER — Other Ambulatory Visit: Payer: Self-pay | Admitting: Nurse Practitioner

## 2020-03-05 DIAGNOSIS — F419 Anxiety disorder, unspecified: Secondary | ICD-10-CM

## 2020-04-02 NOTE — Patient Instructions (Signed)
Health Maintenance Due  Topic Date Due  . Hepatitis C Screening  Never done  . COVID-19 Vaccine (3 - Pfizer risk 4-dose series) 06/19/2019  . INFLUENZA VACCINE  08/27/2019    Depression screen [facility] 2/9 10/12/2018 12/30/2017 12/02/2017  Decreased Interest 0 1 0  Down, Depressed, Hopeless 0 0 1  PHQ - 2 Score 0 1 1  Altered sleeping 2 1 -  Tired, decreased energy 1 1 -  Change in appetite 0 0 -  Feeling bad or failure about yourself  0 1 -  Trouble concentrating 0 1 -  Moving slowly or fidgety/restless 0 0 -  Suicidal thoughts 0 0 -  PHQ-9 Score 3 5 -  Difficult doing work/chores Somewhat difficult Somewhat difficult -

## 2020-04-04 ENCOUNTER — Other Ambulatory Visit: Payer: Self-pay

## 2020-04-04 ENCOUNTER — Ambulatory Visit (INDEPENDENT_AMBULATORY_CARE_PROVIDER_SITE_OTHER): Payer: PRIVATE HEALTH INSURANCE | Admitting: Family Medicine

## 2020-04-04 ENCOUNTER — Encounter: Payer: Self-pay | Admitting: Family Medicine

## 2020-04-04 VITALS — BP 90/70 | HR 79 | Temp 98.0°F | Ht 65.0 in | Wt 155.2 lb

## 2020-04-04 DIAGNOSIS — Z2821 Immunization not carried out because of patient refusal: Secondary | ICD-10-CM

## 2020-04-04 DIAGNOSIS — R635 Abnormal weight gain: Secondary | ICD-10-CM

## 2020-04-04 DIAGNOSIS — Z13220 Encounter for screening for lipoid disorders: Secondary | ICD-10-CM

## 2020-04-04 DIAGNOSIS — F410 Panic disorder [episodic paroxysmal anxiety] without agoraphobia: Secondary | ICD-10-CM

## 2020-04-04 DIAGNOSIS — Z0000 Encounter for general adult medical examination without abnormal findings: Secondary | ICD-10-CM | POA: Diagnosis not present

## 2020-04-04 DIAGNOSIS — F419 Anxiety disorder, unspecified: Secondary | ICD-10-CM | POA: Diagnosis not present

## 2020-04-04 DIAGNOSIS — Z1321 Encounter for screening for nutritional disorder: Secondary | ICD-10-CM | POA: Diagnosis not present

## 2020-04-04 DIAGNOSIS — K760 Fatty (change of) liver, not elsewhere classified: Secondary | ICD-10-CM

## 2020-04-04 LAB — BASIC METABOLIC PANEL
BUN: 10 mg/dL (ref 6–23)
CO2: 32 mEq/L (ref 19–32)
Calcium: 10.2 mg/dL (ref 8.4–10.5)
Chloride: 102 mEq/L (ref 96–112)
Creatinine, Ser: 0.73 mg/dL (ref 0.40–1.20)
GFR: 100.58 mL/min (ref >60.00)
Glucose, Bld: 86 mg/dL (ref 70–99)
Potassium: 5 mEq/L (ref 3.5–5.1)
Sodium: 140 mEq/L (ref 135–145)

## 2020-04-04 LAB — ALT: ALT: 10 U/L (ref 0–35)

## 2020-04-04 LAB — LIPID PANEL
Cholesterol: 174 mg/dL (ref 0–200)
HDL: 61.8 mg/dL (ref >39.00)
LDL Cholesterol: 95 mg/dL (ref 0–99)
NonHDL: 112.44
Total CHOL/HDL Ratio: 3
Triglycerides: 88 mg/dL (ref 0.0–149.0)
VLDL: 17.6 mg/dL (ref 0.0–40.0)

## 2020-04-04 LAB — CBC
HCT: 39.1 % (ref 36.0–46.0)
Hemoglobin: 13.4 g/dL (ref 12.0–15.0)
MCHC: 34.3 g/dL (ref 30.0–36.0)
MCV: 94.3 fl (ref 78.0–100.0)
Platelets: 239 10*3/uL (ref 150.0–400.0)
RBC: 4.15 Mil/uL (ref 3.87–5.11)
RDW: 13.6 % (ref 11.5–15.5)
WBC: 5.6 10*3/uL (ref 4.0–10.5)

## 2020-04-04 LAB — VITAMIN D 25 HYDROXY (VIT D DEFICIENCY, FRACTURES): VITD: 22.65 ng/mL — ABNORMAL LOW (ref 30.00–100.00)

## 2020-04-04 LAB — AST: AST: 15 U/L (ref 0–37)

## 2020-04-04 MED ORDER — VALACYCLOVIR HCL 1 G PO TABS
1000.0000 mg | ORAL_TABLET | Freq: Three times a day (TID) | ORAL | 2 refills | Status: DC
Start: 2020-04-04 — End: 2021-04-22

## 2020-04-04 MED ORDER — SERTRALINE HCL 50 MG PO TABS: 50.0000 mg | ORAL_TABLET | Freq: Every day | ORAL | 3 refills | Status: DC

## 2020-04-04 MED ORDER — LORAZEPAM 0.5 MG PO TABS: 0.5000 mg | ORAL_TABLET | Freq: Two times a day (BID) | ORAL | 2 refills | Status: DC | PRN

## 2020-04-04 NOTE — Progress Notes (Signed)
Amber Case is a 45 y.o. female  Chief Complaint  Patient presents with  . Establish Care    Np here for CPE, she is fasting.  UTD on pap and Covid vaccines.  Pt has not had flu vaccine this season.  Pt would like to discuss weight gain.    HPI: Amber Case is a 45 y.o. female seen today to establish care with our office and for annual CPE, fasting labs. Previous PCP at [facility] - Dr. Emeterio Reeve.  Last PAP: 12/2019 - GYN Dr. Posey Pronto w/ [facility] Last mammo: 12/2019 - GYN Dr. Posey Pronto Last colonoscopy: 03/2019 - tubular adenoma removed, surveillance colonoscopy recommended in 2028.  Specialists: GI Dr. Tarri Glenn ([facility]), GYN Dr. Posey Pronto [facility])  Diet/Exercise: "could use some work" Dental: UTD Vision: wears contacts and glasses and UTD on exam  Med refills needed today? See order  She is concerned about weight gain and discussed this with Dr. Tarri Glenn. Not GI etiology definitely identified. Normal TSH in 10/2019. 6lbs in 83mo.   Lab Results  Component Value Date   TSH 3.44 11/06/2019   Wt Readings from Last 3 Encounters:  04/04/20 155 lb 3.2 oz (70.4 kg)  11/06/19 153 lb (69.4 kg)  05/23/19 149 lb 9.6 oz (67.9 kg)    Past Medical History:  Diagnosis Date  . Abdominal hernia   . Anxiety   . Gestational diabetes    First pregnancy  . H. pylori infection   . History of abnormal cervical Pap smear   . Irritable bowel syndrome   . Palpitations   . SVT (supraventricular tachycardia) ([place])   . Tubular adenoma of colon     Past Surgical History:  Procedure Laterality Date  . CESAREAN SECTION    . DOPPLER ECHOCARDIOGRAPHY  05/24/2009   trace pulmonary regurgitation.  trace tricuspid regurgitation. LV normal.  Trace mitral regurgitation.    . heart monitor     NO RESULT READ  . nuclear testing  08/30/2009   NM MYOCAR MULTI w/SPECT; Mild septal hypokinesis.  EF 63%    Social History   Socioeconomic History  . Marital status: Married    Spouse name: Not on  file  . Number of children: 3  . Years of education: Not on file  . Highest education level: Not on file  Occupational History  . Occupation: homemaker  Tobacco Use  . Smoking status: Never Smoker  . Smokeless tobacco: Never Used  Vaping Use  . Vaping Use: Never used  Substance and Sexual Activity  . Alcohol use: No  . Drug use: No  . Sexual activity: Yes    Partners: Male    Birth control/protection: Condom  Other Topics Concern  . Not on file  Social History Narrative   Her daughter has a chromosome 15 defect   Social Determinants of Health   Financial Resource Strain: Not on file  Food Insecurity: Not on file  Transportation Needs: Not on file  Physical Activity: Not on file  Stress: Not on file  Social Connections: Not on file  Intimate Partner Violence: Not on file    Family History  Problem Relation Age of Onset  . Heart murmur Mother   . Colitis Mother   . Irritable bowel syndrome Mother   . High blood pressure Brother   . Irritable bowel syndrome Brother   . Heart attack Maternal Grandmother   . Heart disease Maternal Grandmother   . Prostate cancer Paternal Grandfather   . Diabetes  Maternal Aunt   . Heart disease Maternal Aunt   . Diabetes Maternal Uncle   . Diabetes Paternal Aunt   . Diabetes Paternal Uncle   . Stomach cancer Maternal Grandfather      Immunization History  Administered Date(s) Administered  . Influenza, Seasonal, Injecte, Preservative Fre 11/24/2011  . Influenza,inj,Quad PF,6+ Mos 10/12/2018  . Influenza-Unspecified 11/24/2011  . PFIZER(Purple Top)SARS-COV-2 Vaccination 05/01/2019, 05/22/2019  . Tdap 01/08/2016, 01/08/2016    Outpatient Encounter Medications as of 04/04/2020  Medication Sig  . LORazepam (ATIVAN) 0.5 MG tablet Take 1 tablet (0.5 mg total) by mouth 2 (two) times daily as needed (severe anxiety/panic).  . Multiple Vitamin (MULTIVITAMIN) tablet Take 1 tablet by mouth daily.  . sertraline (ZOLOFT) 50 MG tablet TAKE  1 TABLET (50 MG TOTAL) BY MOUTH DAILY. FOLLOW-UP REQUIRED FOR ADDITIONAL REFILLS.  Marland Kitchen valACYclovir (VALTREX) 1000 MG tablet TAKE 1 TABLET (1,000 MG TOTAL) BY MOUTH 3 (THREE) TIMES DAILY. FOR 10 DAYS AS NEEDED FOR OUTBREAK   No facility-administered encounter medications on file as of 04/04/2020.     ROS: Gen: no fever, chills  Skin: no rash, itching ENT: no ear pain, ear drainage, nasal congestion, rhinorrhea, sinus pressure, sore throat Eyes: no blurry vision, double vision Resp: no cough, wheeze,SOB CV: no CP, palpitations, LE edema,  GI: IBS w/ alternating diarrhea and constipation GU: no dysuria, urgency, frequency, hematuria MSK: no joint pain, myalgias, back pain Neuro: no dizziness, headache, weakness, vertigo Psych: no depression, + anxiety, no insomnia   Allergies  Allergen Reactions  . Sulfa Antibiotics Hives  . Sulfonamide Derivatives     BP 90/70 (BP Location: Left Arm, Patient Position: Sitting)   Pulse 79   Temp 98 F (36.7 C) (Temporal)   Ht 5\' 5"  (1.651 m)   Wt 155 lb 3.2 oz (70.4 kg)   LMP 03/29/2020   SpO2 97%   BMI 25.83 kg/m   Wt Readings from Last 3 Encounters:  04/04/20 155 lb 3.2 oz (70.4 kg)  11/06/19 153 lb (69.4 kg)  05/23/19 149 lb 9.6 oz (67.9 kg)   Temp Readings from Last 3 Encounters:  04/04/20 98 F (36.7 C) (Temporal)  05/23/19 98.5 F (36.9 C)  04/19/19 (!) 97.3 F (36.3 C) (Skin)   BP Readings from Last 3 Encounters:  04/04/20 90/70  11/06/19 96/62  05/23/19 106/80   Pulse Readings from Last 3 Encounters:  04/04/20 79  11/06/19 88  05/23/19 60     Physical Exam Constitutional:      General: She is not in acute distress.    Appearance: Normal appearance. She is well-developed.  HENT:     Head: Normocephalic and atraumatic.     Right Ear: Tympanic membrane and ear canal normal.     Left Ear: Tympanic membrane and ear canal normal.     Nose: Nose normal.     Mouth/Throat:     Mouth: Mucous membranes are moist.      Pharynx: Oropharynx is clear.  Eyes:     Conjunctiva/sclera: Conjunctivae normal.  Neck:     Thyroid: No thyromegaly.  Cardiovascular:     Rate and Rhythm: Normal rate and regular rhythm.     Heart sounds: Normal heart sounds. No murmur heard.   Pulmonary:     Effort: Pulmonary effort is normal. No respiratory distress.     Breath sounds: Normal breath sounds. No wheezing or rhonchi.  Abdominal:     General: Bowel sounds are normal. There is no distension.  Palpations: Abdomen is soft. There is no mass.     Tenderness: There is no abdominal tenderness.  Musculoskeletal:     Cervical back: Neck supple.     Right lower leg: No edema.     Left lower leg: No edema.  Lymphadenopathy:     Cervical: No cervical adenopathy.  Skin:    General: Skin is warm and dry.  Neurological:     Mental Status: She is alert and oriented to person, place, and time.     Motor: No abnormal muscle tone.     Coordination: Coordination normal.  Psychiatric:        Mood and Affect: Mood normal.        Behavior: Behavior normal.      A/P:  1. Annual physical exam - discussed importance of regular CV exercise, healthy diet, adequate sleep - UTD on PAP, mammo, colonoscopy - UTD on dental and vision exams - immunizations UTD other than flu vaccine which pt declines - ALT - AST - Basic metabolic panel - CBC - next CPE in 1 year  2. Anxiety 3. Panic disorder - stable, controlled Refill: - sertraline (ZOLOFT) 50 MG tablet; Take 1 tablet (50 mg total) by mouth daily.  Dispense: 90 tablet; Refill: 3 - LORazepam (ATIVAN) 0.5 MG tablet; Take 1 tablet (0.5 mg total) by mouth 2 (two) times daily as needed (severe anxiety/panic).  Dispense: 30 tablet; Refill: 2 - pt takes PRN, some weeks not at all, other weeks 3-4 tabs per week - database reviewed and appropriate - last filled 11/2018  4. Fatty infiltration of liver - ALT - AST  5. Screening for lipid disorders - Lipid panel  6. Encounter for  vitamin deficiency screening - VITAMIN D 25 Hydroxy (Vit-D Deficiency, Fractures)  7. Unintended weight gain - 6lbs in past 11 mo but pt thinks 15-20lbs in the past couple of years - normal TSH - discussed this is likely due to decreased metabolism and hormonal fluctuations - increase CV exercise to 17min per week and work to adhere to low carb, low fat diet  8. Influenza vaccination declined by patient      This visit occurred during the SARS-CoV-2 public health emergency.  Safety protocols were in place, including screening questions prior to the visit, additional usage of staff PPE, and extensive cleaning of exam room while observing appropriate contact time as indicated for disinfecting solutions.

## 2020-04-10 ENCOUNTER — Encounter: Payer: Self-pay | Admitting: Family Medicine

## 2020-04-10 ENCOUNTER — Other Ambulatory Visit: Payer: Self-pay | Admitting: Family Medicine

## 2020-04-10 DIAGNOSIS — E559 Vitamin D deficiency, unspecified: Secondary | ICD-10-CM

## 2020-04-10 MED ORDER — VITAMIN D (ERGOCALCIFEROL) 1.25 MG (50000 UNIT) PO CAPS: 50000.0000 [IU] | ORAL_CAPSULE | ORAL | 2 refills | Status: DC

## 2020-08-01 ENCOUNTER — Other Ambulatory Visit: Payer: Self-pay

## 2020-08-01 ENCOUNTER — Ambulatory Visit (INDEPENDENT_AMBULATORY_CARE_PROVIDER_SITE_OTHER): Payer: PRIVATE HEALTH INSURANCE | Admitting: Family Medicine

## 2020-08-01 ENCOUNTER — Telehealth: Payer: Self-pay | Admitting: Family Medicine

## 2020-08-01 ENCOUNTER — Encounter: Payer: Self-pay | Admitting: Family Medicine

## 2020-08-01 VITALS — BP 112/74 | HR 85 | Temp 97.8°F | Ht 65.0 in | Wt 153.6 lb

## 2020-08-01 DIAGNOSIS — R222 Localized swelling, mass and lump, trunk: Secondary | ICD-10-CM

## 2020-08-01 DIAGNOSIS — N63 Unspecified lump in unspecified breast: Secondary | ICD-10-CM

## 2020-08-01 NOTE — Progress Notes (Signed)
Established Patient Office Visit  Subjective:  Patient ID: Amber Case, female    DOB: 1975/02/13  Age: 45 y.o. MRN: [id number]  CC:  Chief Complaint  Patient presents with   Mass    Patient noticed lump on left side of rib cage few weeks ago. No pains.     HPI Amber Case presents for evaluation of a mass just inferior to her left breast at the 6 o'clock position.  She thinks it may been present a few months.  She has no family history of breast disease.  She has had a mammogram this past fall.  Past Medical History:  Diagnosis Date   Abdominal hernia    Anxiety    Gestational diabetes    First pregnancy   H. pylori infection    History of abnormal cervical Pap smear    Irritable bowel syndrome    Palpitations    SVT (supraventricular tachycardia) (HCC)    Tubular adenoma of colon     Past Surgical History:  Procedure Laterality Date   CESAREAN SECTION     DOPPLER ECHOCARDIOGRAPHY  05/24/2009   trace pulmonary regurgitation.  trace tricuspid regurgitation. LV normal.  Trace mitral regurgitation.     heart monitor     NO RESULT READ   nuclear testing  08/30/2009   NM MYOCAR MULTI w/SPECT; Mild septal hypokinesis.  EF 63%    Family History  Problem Relation Age of Onset   Heart murmur Mother    Colitis Mother    Irritable bowel syndrome Mother    High blood pressure Brother    Irritable bowel syndrome Brother    Heart attack Maternal Grandmother    Heart disease Maternal Grandmother    Prostate cancer Paternal Grandfather    Diabetes Maternal Aunt    Heart disease Maternal Aunt    Diabetes Maternal Uncle    Diabetes Paternal Aunt    Diabetes Paternal Uncle    Stomach cancer Maternal Grandfather     Social History   Socioeconomic History   Marital status: Married    Spouse name: Not on file   Number of children: 3   Years of education: Not on file   Highest education level: Not on file  Occupational History   Occupation: homemaker  Tobacco Use    Smoking status: Never   Smokeless tobacco: Never  Vaping Use   Vaping Use: Never used  Substance and Sexual Activity   Alcohol use: No   Drug use: No   Sexual activity: Yes    Partners: Male    Birth control/protection: Condom  Other Topics Concern   Not on file  Social History Narrative   Her daughter has a chromosome 15 defect   Social Determinants of Radio broadcast assistant Strain: Not on file  Food Insecurity: Not on file  Transportation Needs: Not on file  Physical Activity: Not on file  Stress: Not on file  Social Connections: Not on file  Intimate Partner Violence: Not on file    Outpatient Medications Prior to Visit  Medication Sig Dispense Refill   LORazepam (ATIVAN) 0.5 MG tablet Take 1 tablet (0.5 mg total) by mouth 2 (two) times daily as needed (severe anxiety/panic). 30 tablet 2   Multiple Vitamin (MULTIVITAMIN) tablet Take 1 tablet by mouth daily.     sertraline (ZOLOFT) 50 MG tablet Take 1 tablet (50 mg total) by mouth daily. 90 tablet 3   valACYclovir (VALTREX) 1000 MG tablet Take 1 tablet (  1,000 mg total) by mouth 3 (three) times daily. For 10 days as needed for outbreak 30 tablet 2   Vitamin D, Ergocalciferol, (DRISDOL) 1.25 MG (50000 UNIT) CAPS capsule Take 1 capsule (50,000 Units total) by mouth every 7 (seven) days. 5 capsule 2   No facility-administered medications prior to visit.    Allergies  Allergen Reactions   Sulfa Antibiotics Hives   Sulfonamide Derivatives     ROS Review of Systems  Constitutional: Negative.   Respiratory: Negative.    Cardiovascular: Negative.   Gastrointestinal: Negative.   Skin: Negative.   Psychiatric/Behavioral:  The patient is nervous/anxious.      Objective:    Physical Exam Constitutional:      Appearance: Normal appearance.  HENT:     Head: Normocephalic and atraumatic.     Mouth/Throat:     Mouth: Mucous membranes are moist.     Pharynx: Oropharynx is clear. No oropharyngeal exudate or  posterior oropharyngeal erythema.  Eyes:     General: No scleral icterus.       Right eye: No discharge.        Left eye: No discharge.     Extraocular Movements: Extraocular movements intact.     Conjunctiva/sclera: Conjunctivae normal.     Pupils: Pupils are equal, round, and reactive to light.  Cardiovascular:     Rate and Rhythm: Normal rate and regular rhythm.  Pulmonary:     Effort: Pulmonary effort is normal.     Breath sounds: Normal breath sounds.  Chest:     Comments: Patient identifies mass extending to the inferior left breast at the 6 o'clock position.  I could not confirm a mass on exam. Abdominal:     General: Abdomen is flat. Bowel sounds are normal. There is no distension.     Palpations: Abdomen is soft.     Tenderness: There is no abdominal tenderness.  Musculoskeletal:     Cervical back: No rigidity or tenderness.  Lymphadenopathy:     Cervical: No cervical adenopathy.  Neurological:     Mental Status: She is alert.    BP 112/74   Pulse 85   Temp 97.8 F (36.6 C) (Temporal)   Ht 5\' 5"  (1.651 m)   Wt 153 lb 9.6 oz (69.7 kg)   SpO2 97%   BMI 25.56 kg/m  Wt Readings from Last 3 Encounters:  08/01/20 153 lb 9.6 oz (69.7 kg)  04/04/20 155 lb 3.2 oz (70.4 kg)  11/06/19 153 lb (69.4 kg)     Health Maintenance Due  Topic Date Due   Pneumococcal Vaccine 30-82 Years old (1 - PCV) Never done   Hepatitis C Screening  Never done    There are no preventive care reminders to display for this patient.  Lab Results  Component Value Date   TSH 3.44 11/06/2019   Lab Results  Component Value Date   WBC 5.6 04/04/2020   HGB 13.4 04/04/2020   HCT 39.1 04/04/2020   MCV 94.3 04/04/2020   PLT 239.0 04/04/2020   Lab Results  Component Value Date   NA 140 04/04/2020   K 5.0 04/04/2020   CO2 32 04/04/2020   GLUCOSE 86 04/04/2020   BUN 10 04/04/2020   CREATININE 0.73 04/04/2020   BILITOT 0.6 10/12/2018   AST 15 04/04/2020   ALT 10 04/04/2020   PROT 7.2  10/12/2018   CALCIUM 10.2 04/04/2020   GFR 100.58 04/04/2020   Lab Results  Component Value Date   CHOL 174  04/04/2020   Lab Results  Component Value Date   HDL 61.80 04/04/2020   Lab Results  Component Value Date   LDLCALC 95 04/04/2020   Lab Results  Component Value Date   TRIG 88.0 04/04/2020   Lab Results  Component Value Date   CHOLHDL 3 04/04/2020   Lab Results  Component Value Date   HGBA1C 5.2 12/02/2017      Assessment & Plan:   Problem List Items Addressed This Visit       Other   Mass of breast at 6 o'clock position   Relevant Orders   MM Digital Diagnostic Unilat L   Mass of chest wall, left - Primary    No orders of the defined types were placed in this encounter.   Follow-up: Return if symptoms worsen or fail to improve, for or surgical referral if mass enlarges or changes. .  Will send for diagnostic ultrasound of left breast.  Patient agrees.  Patient was given information on lipoma.  We discussed surgical referral if she feels as though the mass is increasing in size or changing in any way.  She will let me know.  Libby Maw, MD

## 2020-08-01 NOTE — Telephone Encounter (Signed)
Pt said she should have an order for an ultrasound but I only saw the one for the mammogram but she said she had already had that done. Are yall putting in an order for an ultrasound?

## 2020-08-01 NOTE — Telephone Encounter (Signed)
The correct order is put in already it is a mammogram and he is aware that she's already had one done but what Dr. Ethelene Hal have ordered is digital diagnostic this is more in depth of the mammogram due to a mass being noticed after patients mammogram. So the order is correct.

## 2020-08-02 NOTE — Telephone Encounter (Signed)
Called pt to make aware of message bellow, left detailed voicemail and let pt know to call [facility] to schedule

## 2020-08-06 ENCOUNTER — Other Ambulatory Visit: Payer: Self-pay | Admitting: Family Medicine

## 2020-08-06 DIAGNOSIS — N63 Unspecified lump in unspecified breast: Secondary | ICD-10-CM

## 2020-08-21 ENCOUNTER — Encounter: Payer: Self-pay | Admitting: Family Medicine

## 2020-08-26 ENCOUNTER — Encounter: Payer: Self-pay | Admitting: Emergency Medicine

## 2020-08-26 ENCOUNTER — Other Ambulatory Visit: Payer: Self-pay

## 2020-08-26 ENCOUNTER — Emergency Department
Admission: EM | Admit: 2020-08-26 | Discharge: 2020-08-26 | Disposition: A | Discharge status: Home / Self Care | Payer: PRIVATE HEALTH INSURANCE | Source: Home / Self Care | Attending: Family Medicine | Admitting: Family Medicine

## 2020-08-26 DIAGNOSIS — H9201 Otalgia, right ear: Secondary | ICD-10-CM

## 2020-08-26 DIAGNOSIS — M26621 Arthralgia of right temporomandibular joint: Secondary | ICD-10-CM

## 2020-08-26 MED ORDER — CYCLOBENZAPRINE HCL 5 MG PO TABS: 5.0000 mg | ORAL_TABLET | Freq: Every day | ORAL | 0 refills | Status: DC

## 2020-08-26 MED ORDER — METHYLPREDNISOLONE 4 MG PO TBPK: ORAL_TABLET | ORAL | 0 refills | Status: DC

## 2020-08-26 NOTE — ED Triage Notes (Signed)
Patient c/o right ear pain that radiates down her right jaw, sore to lay on that side.  No other sx's.  Denies any pain meds.

## 2020-08-26 NOTE — Discharge Instructions (Addendum)
Use warm compresses to area Take the Medrol Dosepak as directed.  Take all of day 1 today.  This is a steroid anti-inflammatory. After the Medrol take Aleve 2 pills in the morning and 2 pills at night until the pain has diminished Take muscle relaxer at night to prevent jaw clenching If you have continued problems, see your dentist Soft foods until you can chew comfortably

## 2020-08-26 NOTE — ED Provider Notes (Signed)
[name]    CSN: [id number] Arrival date & time: 08/26/20  1225      History   Chief Complaint Chief Complaint  Patient presents with   Otalgia    HPI Amber Case is a 45 y.o. female.   HPI  Patient has right ear pain.  Its radiating down the side of her jaw.  Its getting progressively worse.  It kept her awake last night.  No cough cold or runny nose.  No fall or injury.  No loss of hearing.  No fever or chills.  No recent illness.  Past Medical History:  Diagnosis Date   Abdominal hernia    Anxiety    Gestational diabetes    First pregnancy   H. pylori infection    History of abnormal cervical Pap smear    Irritable bowel syndrome    Palpitations    SVT (supraventricular tachycardia) (HCC)    Tubular adenoma of colon     Patient Active Problem List   Diagnosis Date Noted   Mass of chest wall, left 08/01/2020   Vitamin D deficiency 04/10/2020   Anxiety 12/02/2017   Fatty infiltration of liver 12/24/2016   Abnormal stress echo 12/03/2016   PSVT (paroxysmal supraventricular tachycardia) ([place]) 10/29/2016   Irritable bowel syndrome with constipation 01/07/2016   Panic disorder 08/01/2015   Mass of breast at 6 o'clock position 09/24/2014    Past Surgical History:  Procedure Laterality Date   CESAREAN SECTION     DOPPLER ECHOCARDIOGRAPHY  05/24/2009   trace pulmonary regurgitation.  trace tricuspid regurgitation. LV normal.  Trace mitral regurgitation.     heart monitor     NO RESULT READ   nuclear testing  08/30/2009   NM MYOCAR MULTI w/SPECT; Mild septal hypokinesis.  EF 63%    OB History   No obstetric history on file.      Home Medications    Prior to Admission medications   Medication Sig Start Date End Date Taking? Authorizing Provider  cyclobenzaprine (FLEXERIL) 5 MG tablet Take 1 tablet (5 mg total) by mouth at bedtime. 08/26/20  Yes Raylene Everts, MD  LORazepam (ATIVAN) 0.5 MG tablet Take 1 tablet (0.5 mg total) by mouth 2  (two) times daily as needed (severe anxiety/panic). 04/04/20  Yes Cirigliano, Garvin Fila, DO  Multiple Vitamin (MULTIVITAMIN) tablet Take 1 tablet by mouth daily.   Yes [provider]  sertraline (ZOLOFT) 50 MG tablet Take 1 tablet (50 mg total) by mouth daily. 04/04/20  Yes Cirigliano, Mary K, DO  valACYclovir (VALTREX) 1000 MG tablet Take 1 tablet (1,000 mg total) by mouth 3 (three) times daily. For 10 days as needed for outbreak 04/04/20  Yes Cirigliano, Garvin Fila, DO  Vitamin D, Ergocalciferol, (DRISDOL) 1.25 MG (50000 UNIT) CAPS capsule Take 1 capsule (50,000 Units total) by mouth every 7 (seven) days. 04/10/20  Yes Cirigliano, Garvin Fila, DO    Family History Family History  Problem Relation Age of Onset   Heart murmur Mother    Colitis Mother    Irritable bowel syndrome Mother    High blood pressure Brother    Irritable bowel syndrome Brother    Heart attack Maternal Grandmother    Heart disease Maternal Grandmother    Prostate cancer Paternal Grandfather    Diabetes Maternal Aunt    Heart disease Maternal Aunt    Diabetes Maternal Uncle    Diabetes Paternal Aunt    Diabetes Paternal Uncle    Stomach cancer  Maternal Grandfather     Social History Social History   Tobacco Use   Smoking status: Never   Smokeless tobacco: Never  Vaping Use   Vaping Use: Never used  Substance Use Topics   Alcohol use: No   Drug use: No     Allergies   Sulfa antibiotics and Sulfonamide derivatives   Review of Systems Review of Systems See HPI  Physical Exam Triage Vital Signs ED Triage Vitals  Enc Vitals Group     BP 08/26/20 1247 103/72     Pulse Rate 08/26/20 1247 79     Resp --      Temp 08/26/20 1247 98.8 F (37.1 C)     Temp Source 08/26/20 1247 Oral     SpO2 08/26/20 1247 98 %     Weight 08/26/20 1248 155 lb (70.3 kg)     Height 08/26/20 1248 '5\' 4"'$  (1.626 m)     Head Circumference --      Peak Flow --      Pain Score 08/26/20 1248 2     Pain Loc --      Pain Edu? --       Excl. in [place]? --    No data found.  Updated Vital Signs BP 103/72 (BP Location: Left Arm)   Pulse 79   Temp 98.8 F (37.1 C) (Oral)   Ht '5\' 4"'$  (1.626 m)   Wt 70.3 kg   LMP 08/19/2020   SpO2 98%   BMI 26.61 kg/m     Physical Exam Constitutional:      General: She is not in acute distress.    Appearance: She is well-developed.  HENT:     Head: Normocephalic and atraumatic.     Jaw: There is normal jaw occlusion. Tenderness present.     Comments: Tenderness in right TMJ joint    Right Ear: Tympanic membrane, ear canal and external ear normal.     Left Ear: Tympanic membrane, ear canal and external ear normal.     Nose: Nose normal. No congestion.     Mouth/Throat:     Mouth: Mucous membranes are moist.     Pharynx: No posterior oropharyngeal erythema.     Comments: Teeth in good repair Eyes:     Conjunctiva/sclera: Conjunctivae normal.     Pupils: Pupils are equal, round, and reactive to light.  Cardiovascular:     Rate and Rhythm: Normal rate.  Pulmonary:     Effort: Pulmonary effort is normal. No respiratory distress.  Abdominal:     General: There is no distension.     Palpations: Abdomen is soft.  Musculoskeletal:        General: Normal range of motion.     Cervical back: Normal range of motion.  Skin:    General: Skin is warm and dry.  Neurological:     Mental Status: She is alert.     UC Treatments / Results  Labs (all labs ordered are listed, but only abnormal results are displayed) Labs Reviewed - No data to display  EKG   Radiology No results found.  Procedures Procedures (including critical care time)  Medications Ordered in UC Medications - No data to display  Initial Impression / Assessment and Plan / UC Course  I have reviewed the triage vital signs and the nursing notes.  Pertinent labs & imaging results that were available during my care of the patient were reviewed by me and considered in my medical decision making (see  chart  for details).     Discussed TMJ.  Causes.  Treatment.  Follow-up Final Clinical Impressions(s) / UC Diagnoses   Final diagnoses:  Arthralgia of right temporomandibular joint  Right ear pain     Discharge Instructions      Use warm compresses to area Take the Medrol Dosepak as directed.  Take all of day 1 today.  This is a steroid anti-inflammatory. After the Medrol take Aleve 2 pills in the morning and 2 pills at night until the pain has diminished Take muscle relaxer at night to prevent jaw clenching If you have continued problems, see your dentist Soft foods until you can chew comfortably   ED Prescriptions     Medication Sig Dispense Auth. Provider   cyclobenzaprine (FLEXERIL) 5 MG tablet Take 1 tablet (5 mg total) by mouth at bedtime. 15 tablet Raylene Everts, MD      PDMP not reviewed this encounter.   Raylene Everts, MD 08/26/20 [phone]

## 2020-09-12 ENCOUNTER — Ambulatory Visit
Admission: RE | Admit: 2020-09-12 | Discharge: 2020-09-12 | Disposition: A | Discharge status: Home / Self Care | Payer: PRIVATE HEALTH INSURANCE | Source: Ambulatory Visit | Attending: Family Medicine | Admitting: Family Medicine

## 2020-09-12 ENCOUNTER — Other Ambulatory Visit: Payer: Self-pay

## 2020-09-12 DIAGNOSIS — N63 Unspecified lump in unspecified breast: Secondary | ICD-10-CM

## 2021-03-07 IMAGING — US US ABDOMEN COMPLETE
1 series · 14 of 25 positions shown · non-contrast
Comparison: None.

CLINICAL DATA: Pain with abdominal bloating

EXAM:
ABDOMEN ULTRASOUND COMPLETE

[Series 1: us abdomen complete · 14 of 90 slices shown]
[im 1/90]
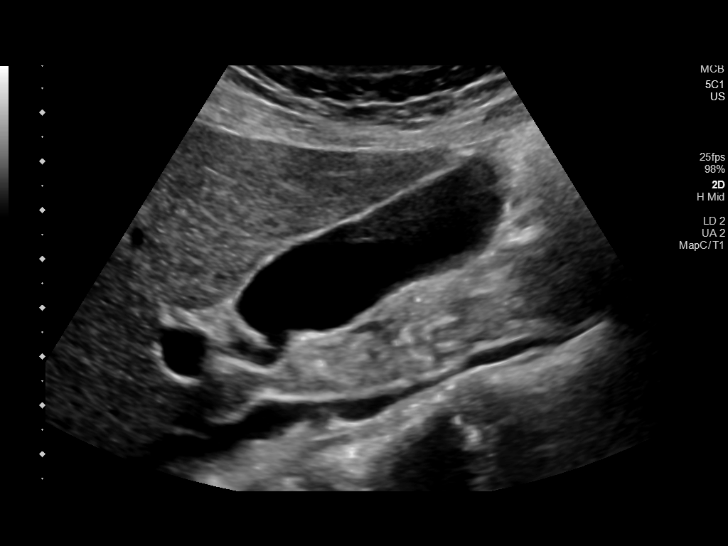
[im 8/90]
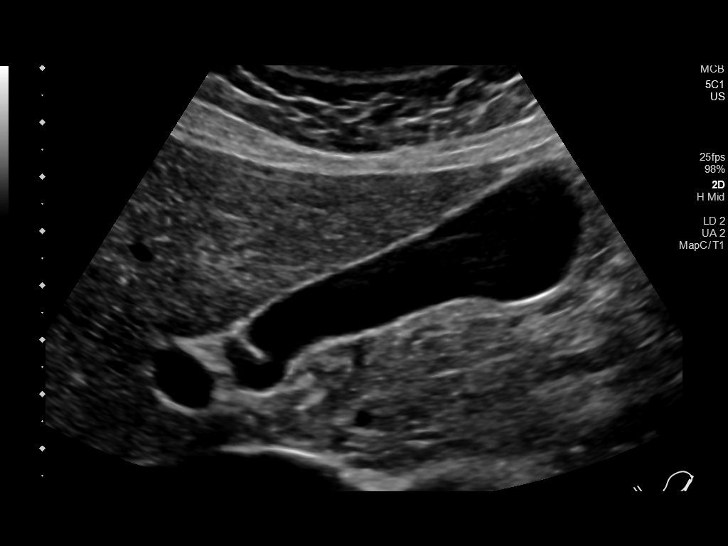
[im 15/90]
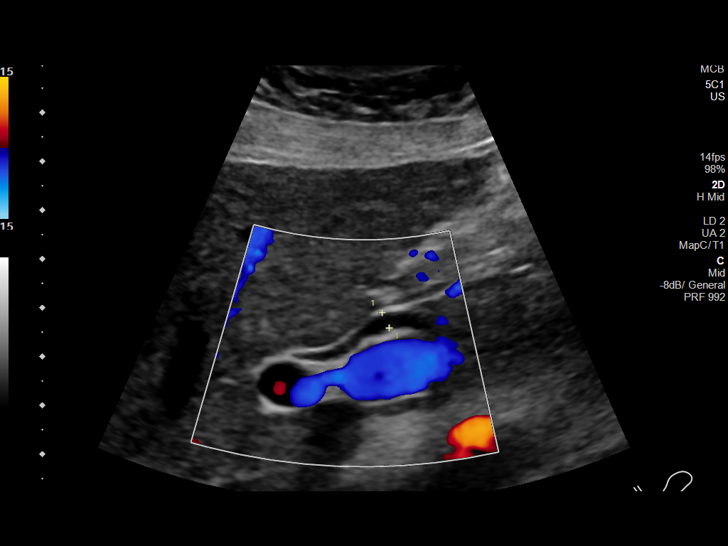
[im 23/90]
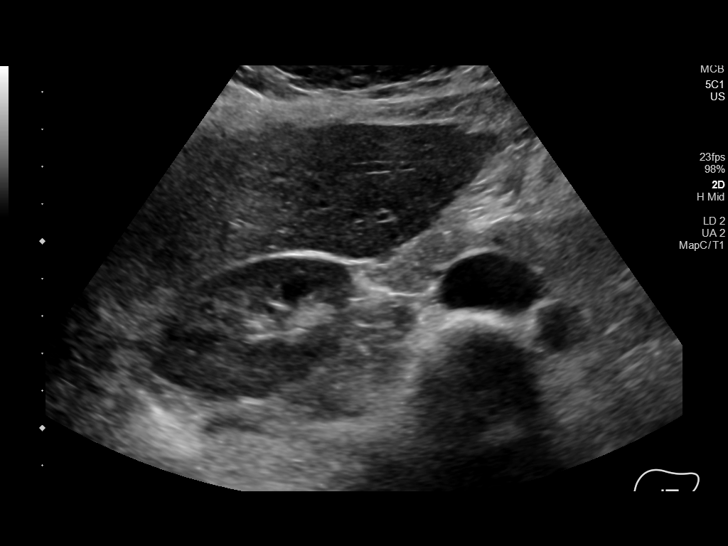
[im 30/90]
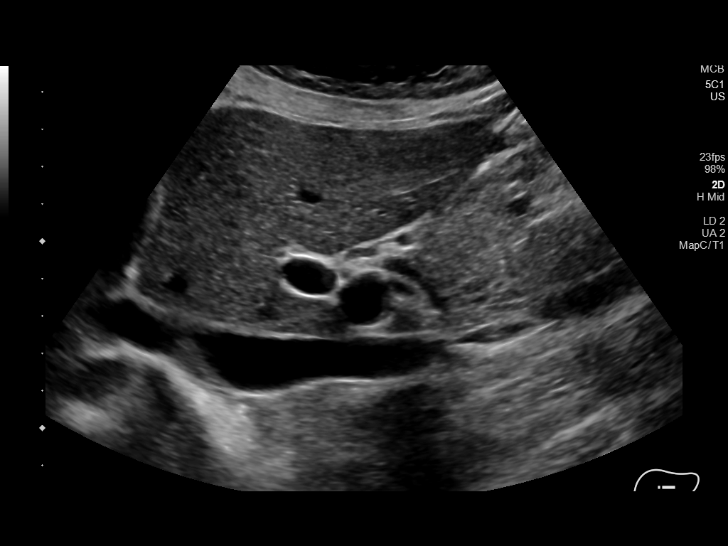
[im 34/90]
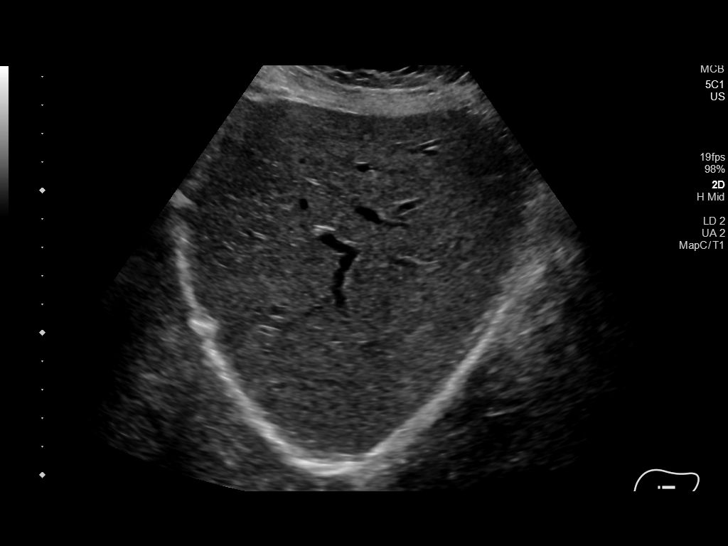
[im 41/90]
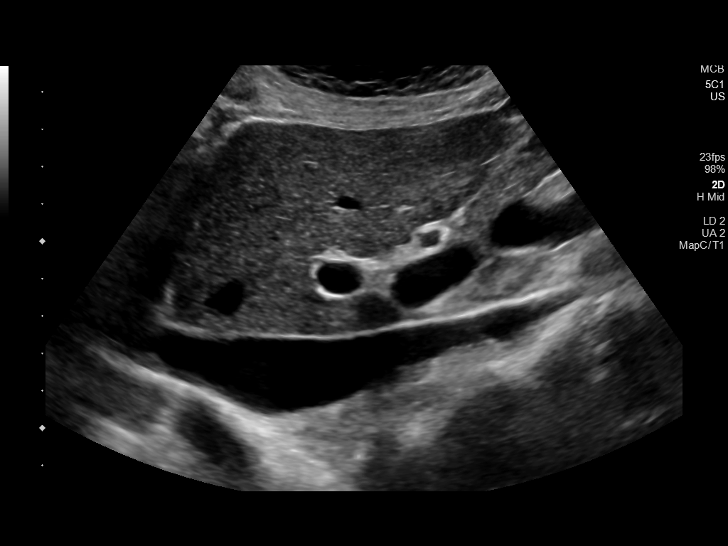
[im 49/90]
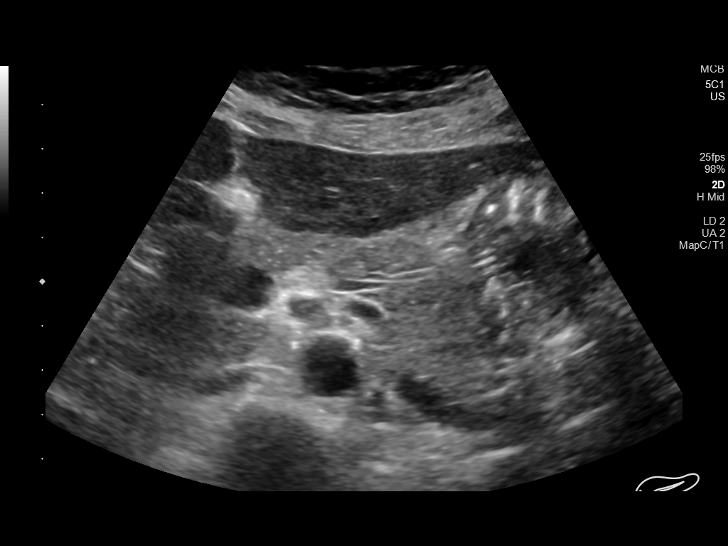
[im 56/90]
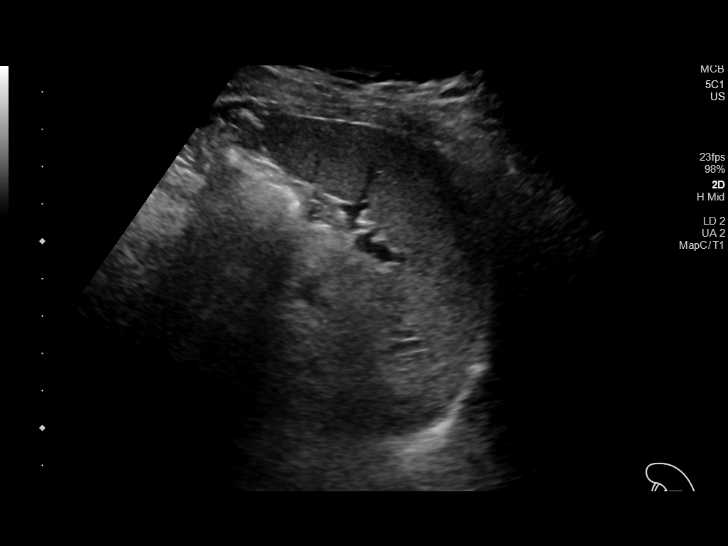
[im 60/90]
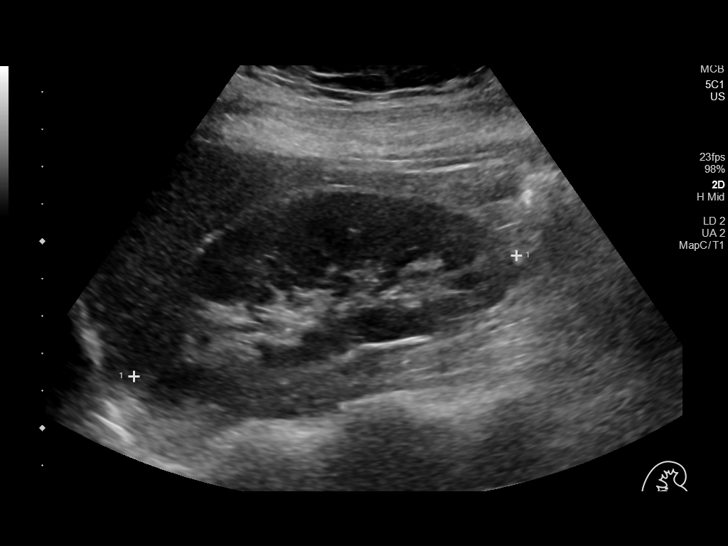
[im 67/90]
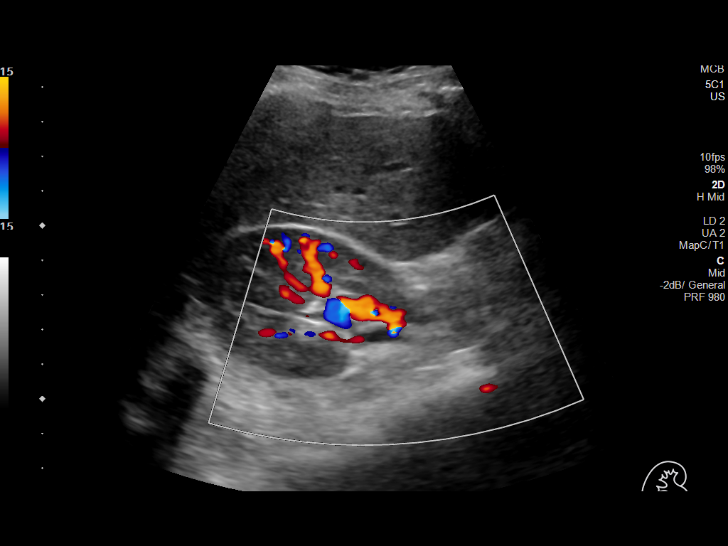
[im 75/90]
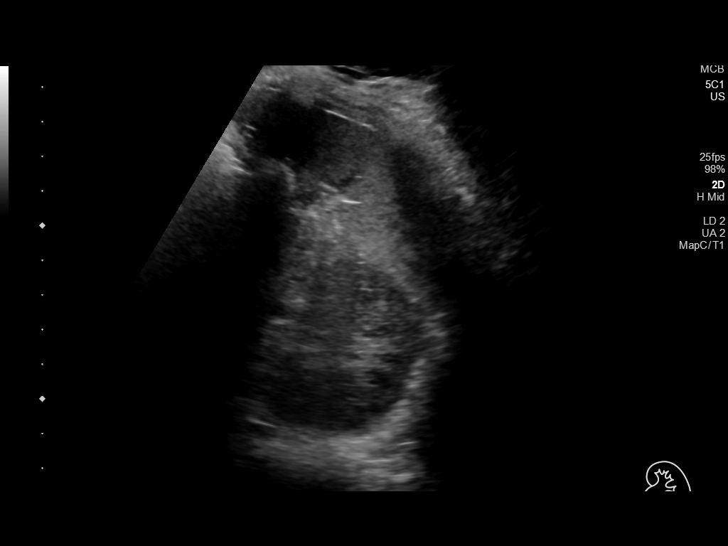
[im 82/90]
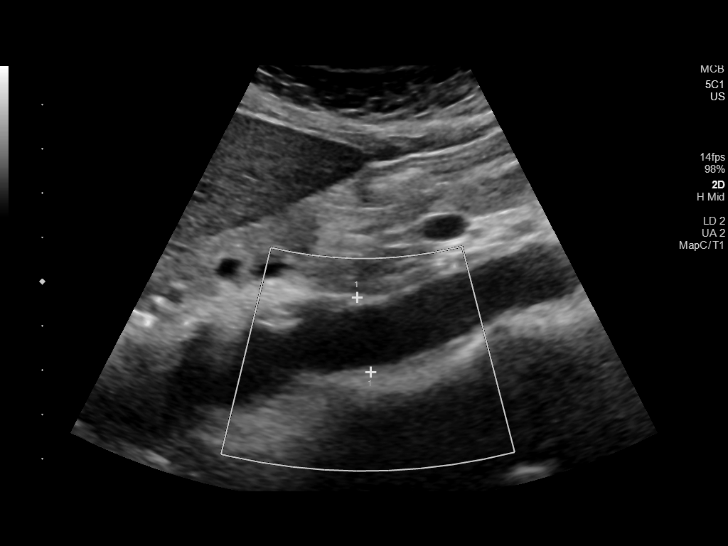
[im 90/90]
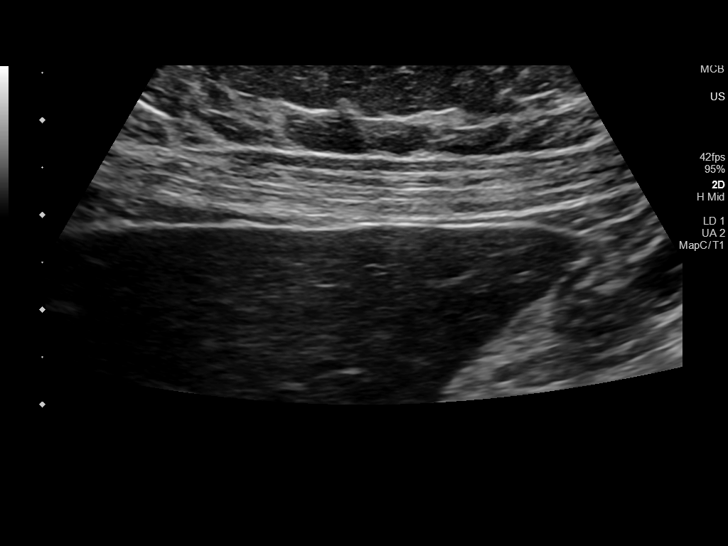

[14 of 25 positions shown; findings below may reference images not displayed]

FINDINGS: Gallbladder: No gallstones or wall thickening visualized. There is
no pericholecystic fluid. No sonographic Murphy sign noted by
sonographer.

Common bile duct: Diameter: 4 mm. No intrahepatic, common hepatic,
or common bile duct dilatation.

Liver: No focal lesion identified. Within normal limits in
parenchymal echogenicity. Portal vein is patent on color Doppler
imaging with normal direction of blood flow towards the liver.

IVC: No abnormality visualized.

Pancreas: No pancreatic mass or inflammatory focus.

Spleen: Size and appearance within normal limits.

Right Kidney: Length: 10.7 cm. Echogenicity within normal limits. No
mass or hydronephrosis visualized.

Left Kidney: Length: 11.4 cm. Echogenicity within normal limits. No
mass or hydronephrosis visualized.

Abdominal aorta: No aneurysm visualized.

Other findings: No demonstrable ascites.
IMPRESSION: Study within normal limits.

## 2021-04-08 ENCOUNTER — Encounter: Payer: Self-pay | Admitting: Family Medicine

## 2021-04-09 ENCOUNTER — Telehealth (INDEPENDENT_AMBULATORY_CARE_PROVIDER_SITE_OTHER): Payer: PRIVATE HEALTH INSURANCE | Admitting: Nurse Practitioner

## 2021-04-09 ENCOUNTER — Encounter: Payer: Self-pay | Admitting: Nurse Practitioner

## 2021-04-09 VITALS — Temp 98.0°F

## 2021-04-09 DIAGNOSIS — U071 COVID-19: Secondary | ICD-10-CM

## 2021-04-09 MED ORDER — NIRMATRELVIR/RITONAVIR (PAXLOVID)TABLET: 3.0000 | ORAL_TABLET | Freq: Two times a day (BID) | ORAL | 0 refills | Status: AC

## 2021-04-09 NOTE — Patient Instructions (Signed)
It was great to see you! ? ?Start Paxlovid twice a day for 5 days.  This was sent to your pharmacy.  You can continue to take NyQuil as needed.  Remember to isolate for 5 days, and then wearing mask for 5 days if needing to go out in public.  Continue to get plenty of rest and drink plenty of fluids.  If your symptoms worsen, have trouble breathing, chest pain, dizziness please schedule an appointment in person to be seen. ? ?Let's follow-up if your symptoms worsen or with any concerns ? ?Take care, ? ?Vance Peper, NP ? ?

## 2021-04-09 NOTE — Progress Notes (Signed)
?[facility] ?[facility] ?[place] ?[place] [address] ?Dept: [phone] ?Dept Fax: [phone] ? ?Virtual Video Visit ? ?I connected with Amber Case on 04/09/21 at  8:40 AM EDT by a video enabled telemedicine application and verified that I am speaking with the correct person using two identifiers. ? ?Location patient: Home ?Location provider: Clinic ?Persons participating in the virtual visit: Patient, Provider, [place] ? ?I discussed the limitations of evaluation and management by telemedicine and the availability of in person appointments. The patient expressed understanding and agreed to proceed. ? ?Chief Complaint  ?Patient presents with  ? URI  ?  Pt c/o loss of voice, congestion, headache, coughing, and cold sweats x2 days. At-home covid test positive 3/13  ? ? ?SUBJECTIVE: ? ?HPI: Amber Case is a 46 y.o. female who presents with congestion, headache, and coughing for 2 days. She had a positive home covid-19 test on 04/08/21.  ? ?UPPER RESPIRATORY TRACT INFECTION ? ?Fever: no, however has sweating ?Cough: yes ?Shortness of breath: no ?Wheezing: no ?Chest pain: no ?Chest tightness: no ?Chest congestion: no ?Nasal congestion: yes ?Runny nose: yes ?Post nasal drip: no ?Sneezing: yes ?Sore throat:  scratchy ?Swollen glands: no ?Sinus pressure:  comes and goes ?Headache: yes ?Face pain: no ?Toothache: no ?Ear pain: no bilateral ?Ear pressure: no bilateral ?Eyes red/itching:no ?Eye drainage/crusting: no  ?Vomiting: no ?Rash: no ?Fatigue: yes ?Sick contacts: yes - friend she met with ?Strep contacts: no  ?Context: stable ?Recurrent sinusitis: no ?Relief with OTC cold/cough medications: yes  ?Treatments attempted: nyquil  ? ?Patient Active Problem List  ? Diagnosis Date Noted  ? COVID-19 04/09/2021  ? Mass of chest wall, left 08/01/2020  ? Vitamin D deficiency 04/10/2020  ? Anxiety 12/02/2017  ? Fatty infiltration of liver 12/24/2016  ? Abnormal stress echo  12/03/2016  ? PSVT (paroxysmal supraventricular tachycardia) ([place]) 10/29/2016  ? Irritable bowel syndrome with constipation 01/07/2016  ? Panic disorder 08/01/2015  ? Mass of breast at 6 o'clock position 09/24/2014  ? ? ?Past Surgical History:  ?Procedure Laterality Date  ? CESAREAN SECTION    ? DOPPLER ECHOCARDIOGRAPHY  05/24/2009  ? trace pulmonary regurgitation.  trace tricuspid regurgitation. LV normal.  Trace mitral regurgitation.    ? heart monitor    ? NO RESULT READ  ? nuclear testing  08/30/2009  ? NM MYOCAR MULTI w/SPECT; Mild septal hypokinesis.  EF 63%  ? ? ?Family History  ?Problem Relation Age of Onset  ? Heart murmur Mother   ? Colitis Mother   ? Irritable bowel syndrome Mother   ? High blood pressure Brother   ? Irritable bowel syndrome Brother   ? Heart attack Maternal Grandmother   ? Heart disease Maternal Grandmother   ? Prostate cancer Paternal Grandfather   ? Diabetes Maternal Aunt   ? Heart disease Maternal Aunt   ? Diabetes Maternal Uncle   ? Diabetes Paternal Aunt   ? Diabetes Paternal Uncle   ? Stomach cancer Maternal Grandfather   ? ? ?Social History  ? ?Tobacco Use  ? Smoking status: Never  ? Smokeless tobacco: Never  ?Vaping Use  ? Vaping Use: Never used  ?Substance Use Topics  ? Alcohol use: No  ? Drug use: No  ? ? ? ?Current Outpatient Medications:  ?  nirmatrelvir/ritonavir EUA (PAXLOVID) 20 x 150 MG & 10 x 100MG TABS, Take 3 tablets by mouth 2 (two) times daily for 5 days. (Take nirmatrelvir 150 mg two tablets  twice daily for 5 days and ritonavir 100 mg one tablet twice daily for 5 days) Patient GFR is 100, Disp: 30 tablet, Rfl: 0 ?  cyclobenzaprine (FLEXERIL) 5 MG tablet, Take 1 tablet (5 mg total) by mouth at bedtime., Disp: 15 tablet, Rfl: 0 ?  LORazepam (ATIVAN) 0.5 MG tablet, Take 1 tablet (0.5 mg total) by mouth 2 (two) times daily as needed (severe anxiety/panic)., Disp: 30 tablet, Rfl: 2 ?  methylPREDNISolone (MEDROL DOSEPAK) 4 MG TBPK tablet, tad, Disp: 21 tablet, Rfl: 0 ?   Multiple Vitamin (MULTIVITAMIN) tablet, Take 1 tablet by mouth daily., Disp: , Rfl:  ?  sertraline (ZOLOFT) 50 MG tablet, Take 1 tablet (50 mg total) by mouth daily., Disp: 90 tablet, Rfl: 3 ?  valACYclovir (VALTREX) 1000 MG tablet, Take 1 tablet (1,000 mg total) by mouth 3 (three) times daily. For 10 days as needed for outbreak, Disp: 30 tablet, Rfl: 2 ?  Vitamin D, Ergocalciferol, (DRISDOL) 1.25 MG (50000 UNIT) CAPS capsule, Take 1 capsule (50,000 Units total) by mouth every 7 (seven) days., Disp: 5 capsule, Rfl: 2 ? ?Allergies  ?Allergen Reactions  ? Sulfa Antibiotics Hives  ? Ofloxacin Itching  ?  Hives; Sweating  ? Sulfonamide Derivatives   ? ? ?ROS: See pertinent positives and negatives per HPI. ? ?OBSERVATIONS/OBJECTIVE: ? ?VITALS per patient if applicable: ?Today's Vitals  ? 04/09/21 0840  ?Temp: 98 ?F (36.7 ?C)  ?TempSrc: Oral  ? ?There is no height or weight on file to calculate BMI. ?  ? ?GENERAL: Alert and oriented. Appears well and in no acute distress. ? ?HEENT: Atraumatic. Conjunctiva clear. No obvious abnormalities on inspection of external nose and ears. ? ?NECK: Normal movements of the head and neck. ? ?LUNGS: On inspection, no signs of respiratory distress. Breathing rate appears normal. No obvious gross SOB, gasping or wheezing, and no conversational dyspnea. ? ?CV: No obvious cyanosis. ? ?MS: Moves all visible extremities without noticeable abnormality. ? ?PSYCH/NEURO: Pleasant and cooperative. No obvious depression or anxiety. Speech and thought processing grossly intact. ? ?ASSESSMENT AND PLAN: ? ?Problem List Items Addressed This Visit   ? ?  ? Other  ? COVID-19 - Primary  ?  Tested positive for COVID-19 yesterday, symptoms started 2 days ago.  With history of PSVT and fatty liver, will treat with Paxlovid twice daily x5 days.  Encouraged rest and fluids.  Can continue NyQuil as needed. Reviewed home care instructions for COVID. Advised self-isolation at home for at least 5 days. After 5  days, if improved and fever resolved, can be in public, but should wear a mask around others for an additional 5 days. If symptoms, esp, dyspnea develops/worsens, recommend in-person evaluation at either an urgent care or the emergency room. ? ?  ?  ? Relevant Medications  ? nirmatrelvir/ritonavir EUA (PAXLOVID) 20 x 150 MG & 10 x 100MG TABS  ? ?  ?I discussed the assessment and treatment plan with the patient. The patient was provided an opportunity to ask questions and all were answered. The patient agreed with the plan and demonstrated an understanding of the instructions. ?  ?The patient was advised to call back or seek an in-person evaluation if the symptoms worsen or if the condition fails to improve as anticipated. ? ?Time spent on call:  8 minutes with patient face to face via video conference. More than 50% of this time was spent in counseling and coordination of care. 5 minutes total spent in review of patient's record and  preparation of their chart. ? ? ?Charyl Dancer, NP  ?

## 2021-04-09 NOTE — Assessment & Plan Note (Signed)
Tested positive for COVID-19 yesterday, symptoms started 2 days ago.  With history of PSVT and fatty liver, will treat with Paxlovid twice daily x5 days.  Encouraged rest and fluids.  Can continue NyQuil as needed. Reviewed home care instructions for COVID. Advised self-isolation at home for at least 5 days. After 5 days, if improved and fever resolved, can be in public, but should wear a mask around others for an additional 5 days. If symptoms, esp, dyspnea develops/worsens, recommend in-person evaluation at either an urgent care or the emergency room. ? ?

## 2021-04-21 NOTE — Progress Notes (Signed)
? ?BP 113/71 (BP Location: Left Arm, Patient Position: Sitting, Cuff Size: Normal)   Pulse 75   Temp (!) 96 ?F (35.6 ?C) (Temporal)   Ht '5\' 4"'$  (1.626 m)   Wt 147 lb 12.8 oz (67 kg)   SpO2 96%   BMI 25.37 kg/m?   ? ?Subjective:  ? ? Patient ID: Amber Case, female    DOB: 28-Aug-1975, 46 y.o.   MRN: [id number] ? ?CC: ?Chief Complaint  ?Patient presents with  ? Transitions Of Care  ?  TOC. Est care w/ CPE. Pt is fasting. Pt c/o sharp pain when taking dep breaths on left side of ribs.   ? ? ?HPI: ?Amber Case is a 46 y.o. female presenting on 04/22/2021 to transfer care to a new provider and for comprehensive medical examination. Current medical complaints include: pain to left rib ? ?4 nights ago, she woke up in the middle of the night with a sharp pain in her left rib under her left breast. If she stayed still, she was ok, but if she moved it hurt. Over the past few days it has improved, she can move without pain, but when she takes a deep breath she notices a sharp pain. She took tylenol on the first day. She has not used ice or heat. The pain does not radiate. She denies shortness of breath.  ? ?She currently lives with: husband and 3 kids ?Menopausal Symptoms: yes - endorses some hot flashes ? ?Depression Screen done today and results listed below:  ? ?  04/22/2021  ?  9:47 AM 08/01/2020  ? 11:03 AM 10/12/2018  ?  8:55 AM 12/30/2017  ?  8:36 AM 12/02/2017  ? 10:11 AM  ?Depression screen PHQ 2/9  ?Decreased Interest 0 0 0 1 0  ?Down, Depressed, Hopeless 0 0 0 0 1  ?PHQ - 2 Score 0 0 0 1 1  ?Altered sleeping '1  2 1   '$ ?Tired, decreased energy '1  1 1   '$ ?Change in appetite 1  0 0   ?Feeling bad or failure about yourself  0  0 1   ?Trouble concentrating 1  0 1   ?Moving slowly or fidgety/restless 0  0 0   ?Suicidal thoughts 0  0 0   ?PHQ-9 Score '4  3 5   '$ ?Difficult doing work/chores Somewhat difficult  Somewhat difficult Somewhat difficult   ? ? ?  04/22/2021  ?  9:47 AM 10/12/2018  ?  9:08 AM 12/30/2017  ?  8:36 AM  12/02/2017  ? 10:12 AM  ?GAD 7 : Generalized Anxiety Score  ?Nervous, Anxious, on Edge '1 1 1 2  '$ ?Control/stop worrying 1 0 1 2  ?Worry too much - different things 1 0 1 2  ?Trouble relaxing 0 0 1 2  ?Restless 0 0 0 1  ?Easily annoyed or irritable 1 1 0 2  ?Afraid - awful might happen 0 0 0 1  ?Total GAD 7 Score '4 2 4 12  '$ ?Anxiety Difficulty Somewhat difficult Somewhat difficult Somewhat difficult Somewhat difficult  ? ? ?The patient does not have a history of falls. I did not complete a risk assessment for falls. A plan of care for falls was not documented. ? ? ?Past Medical History:  ?Past Medical History:  ?Diagnosis Date  ? Abdominal hernia   ? Anxiety   ? Gestational diabetes   ? First pregnancy  ? H. pylori infection   ? History of abnormal cervical Pap smear   ?  Irritable bowel syndrome   ? Palpitations   ? SVT (supraventricular tachycardia) ([place])   ? Tubular adenoma of colon   ? ? ?Surgical History:  ?Past Surgical History:  ?Procedure Laterality Date  ? CESAREAN SECTION    ? DOPPLER ECHOCARDIOGRAPHY  05/24/2009  ? trace pulmonary regurgitation.  trace tricuspid regurgitation. LV normal.  Trace mitral regurgitation.    ? heart monitor    ? NO RESULT READ  ? nuclear testing  08/30/2009  ? NM MYOCAR MULTI w/SPECT; Mild septal hypokinesis.  EF 63%  ? PLACEMENT OF BREAST IMPLANTS Bilateral   ? ? ?Medications:  ?Current Outpatient Medications on File Prior to Visit  ?Medication Sig  ? cyclobenzaprine (FLEXERIL) 5 MG tablet Take 1 tablet (5 mg total) by mouth at bedtime.  ? Multiple Vitamin (MULTIVITAMIN) tablet Take 1 tablet by mouth daily.  ? ?No current facility-administered medications on file prior to visit.  ? ? ?Allergies:  ?Allergies  ?Allergen Reactions  ? Sulfa Antibiotics Hives  ? Ofloxacin Itching  ?  Hives; Sweating  ? Sulfonamide Derivatives   ? ? ?Social History:  ?Social History  ? ?Socioeconomic History  ? Marital status: Married  ?  Spouse name: Not on file  ? Number of children: 3  ? Years of  education: Not on file  ? Highest education level: Not on file  ?Occupational History  ? Occupation: homemaker  ?Tobacco Use  ? Smoking status: Never  ? Smokeless tobacco: Never  ?Vaping Use  ? Vaping Use: Never used  ?Substance and Sexual Activity  ? Alcohol use: No  ? Drug use: No  ? Sexual activity: Yes  ?  Partners: Male  ?  Birth control/protection: Condom  ?Other Topics Concern  ? Not on file  ?Social History Narrative  ? Her daughter has a chromosome 15 defect  ? ?Social Determinants of Health  ? ?Financial Resource Strain: Not on file  ?Food Insecurity: Not on file  ?Transportation Needs: Not on file  ?Physical Activity: Not on file  ?Stress: Not on file  ?Social Connections: Not on file  ?Intimate Partner Violence: Not on file  ? ?Social History  ? ?Tobacco Use  ?Smoking Status Never  ?Smokeless Tobacco Never  ? ?Social History  ? ?Substance and Sexual Activity  ?Alcohol Use No  ? ? ?Family History:  ?Family History  ?Problem Relation Age of Onset  ? Heart murmur Mother   ? Colitis Mother   ? Irritable bowel syndrome Mother   ? High blood pressure Brother   ? Irritable bowel syndrome Brother   ? Heart attack Maternal Grandmother   ? Heart disease Maternal Grandmother   ? Prostate cancer Paternal Grandfather   ? Diabetes Maternal Aunt   ? Heart disease Maternal Aunt   ? Diabetes Maternal Uncle   ? Diabetes Paternal Aunt   ? Diabetes Paternal Uncle   ? Stomach cancer Maternal Grandfather   ? ? ?Past medical history, surgical history, medications, allergies, family history and social history reviewed with patient today and changes made to appropriate areas of the chart.  ? ?Review of Systems  ?Constitutional:  Positive for malaise/fatigue.  ?HENT: Negative.    ?Eyes: Negative.   ?Respiratory: Negative.    ?Cardiovascular: Negative.   ?Gastrointestinal:  Positive for abdominal pain and diarrhea. Negative for blood in stool and constipation.  ?Genitourinary: Negative.   ?Musculoskeletal:   ?     Left rib pain ?   ?Skin: Negative.   ?Neurological: Negative.   ?  Psychiatric/Behavioral:  The patient is nervous/anxious.   ?All other ROS negative except what is listed above and in the HPI.  ? ?   ?Objective:  ?  ?BP 113/71 (BP Location: Left Arm, Patient Position: Sitting, Cuff Size: Normal)   Pulse 75   Temp (!) 96 ?F (35.6 ?C) (Temporal)   Ht '5\' 4"'$  (1.626 m)   Wt 147 lb 12.8 oz (67 kg)   SpO2 96%   BMI 25.37 kg/m?   ?Wt Readings from Last 3 Encounters:  ?04/22/21 147 lb 12.8 oz (67 kg)  ?08/26/20 155 lb (70.3 kg)  ?08/01/20 153 lb 9.6 oz (69.7 kg)  ?  ?Physical Exam ?Vitals and nursing note reviewed.  ?Constitutional:   ?   General: She is not in acute distress. ?   Appearance: Normal appearance.  ?HENT:  ?   Head: Normocephalic and atraumatic.  ?   Right Ear: Tympanic membrane, ear canal and external ear normal.  ?   Left Ear: Tympanic membrane, ear canal and external ear normal.  ?Eyes:  ?   Conjunctiva/sclera: Conjunctivae normal.  ?Cardiovascular:  ?   Rate and Rhythm: Normal rate and regular rhythm.  ?   Pulses: Normal pulses.  ?   Heart sounds: Normal heart sounds.  ?Pulmonary:  ?   Effort: Pulmonary effort is normal.  ?   Breath sounds: Normal breath sounds.  ?Abdominal:  ?   General: Bowel sounds are normal.  ?   Palpations: Abdomen is soft.  ?   Tenderness: There is no abdominal tenderness.  ?Musculoskeletal:     ?   General: Tenderness (left rib under left breast) present. Normal range of motion.  ?   Cervical back: Normal range of motion and neck supple. No tenderness.  ?   Right lower leg: No edema.  ?   Left lower leg: No edema.  ?Lymphadenopathy:  ?   Cervical: No cervical adenopathy.  ?Skin: ?   General: Skin is warm and dry.  ?Neurological:  ?   General: No focal deficit present.  ?   Mental Status: She is alert and oriented to person, place, and time.  ?   Cranial Nerves: No cranial nerve deficit.  ?   Coordination: Coordination normal.  ?   Gait: Gait normal.  ?Psychiatric:     ?   Mood and Affect: Mood  normal.     ?   Behavior: Behavior normal.     ?   Thought Content: Thought content normal.     ?   Judgment: Judgment normal.  ? ? ?Results for orders placed or performed in visit on 04/04/20  ?ALT  ?Result Value Ref R

## 2021-04-22 ENCOUNTER — Encounter: Payer: Self-pay | Admitting: Nurse Practitioner

## 2021-04-22 ENCOUNTER — Ambulatory Visit (INDEPENDENT_AMBULATORY_CARE_PROVIDER_SITE_OTHER): Payer: PRIVATE HEALTH INSURANCE | Admitting: Nurse Practitioner

## 2021-04-22 VITALS — BP 113/71 | HR 75 | Temp 96.0°F | Ht 64.0 in | Wt 147.8 lb

## 2021-04-22 DIAGNOSIS — Z136 Encounter for screening for cardiovascular disorders: Secondary | ICD-10-CM | POA: Diagnosis not present

## 2021-04-22 DIAGNOSIS — F410 Panic disorder [episodic paroxysmal anxiety] without agoraphobia: Secondary | ICD-10-CM

## 2021-04-22 DIAGNOSIS — F419 Anxiety disorder, unspecified: Secondary | ICD-10-CM

## 2021-04-22 DIAGNOSIS — R0781 Pleurodynia: Secondary | ICD-10-CM

## 2021-04-22 DIAGNOSIS — E559 Vitamin D deficiency, unspecified: Secondary | ICD-10-CM

## 2021-04-22 DIAGNOSIS — Z13220 Encounter for screening for lipoid disorders: Secondary | ICD-10-CM | POA: Diagnosis not present

## 2021-04-22 DIAGNOSIS — I471 Supraventricular tachycardia: Secondary | ICD-10-CM

## 2021-04-22 DIAGNOSIS — Z0000 Encounter for general adult medical examination without abnormal findings: Secondary | ICD-10-CM

## 2021-04-22 LAB — CBC WITH DIFFERENTIAL/PLATELET
Basophils Absolute: 0 10*3/uL (ref 0.0–0.1)
Basophils Relative: 0.6 % (ref 0.0–3.0)
Eosinophils Absolute: 0.2 10*3/uL (ref 0.0–0.7)
Eosinophils Relative: 2.8 % (ref 0.0–5.0)
HCT: 38.9 % (ref 36.0–46.0)
Hemoglobin: 13.1 g/dL (ref 12.0–15.0)
Lymphocytes Relative: 43.3 % (ref 12.0–46.0)
Lymphs Abs: 2.5 10*3/uL (ref 0.7–4.0)
MCHC: 33.6 g/dL (ref 30.0–36.0)
MCV: 94.6 fl (ref 78.0–100.0)
Monocytes Absolute: 0.4 10*3/uL (ref 0.1–1.0)
Monocytes Relative: 7.6 % (ref 3.0–12.0)
Neutro Abs: 2.6 10*3/uL (ref 1.4–7.7)
Neutrophils Relative %: 45.7 % (ref 43.0–77.0)
Platelets: 256 10*3/uL (ref 150.0–400.0)
RBC: 4.11 Mil/uL (ref 3.87–5.11)
RDW: 13 % (ref 11.5–15.5)
WBC: 5.8 10*3/uL (ref 4.0–10.5)

## 2021-04-22 LAB — COMPREHENSIVE METABOLIC PANEL
ALT: 12 U/L (ref 0–35)
AST: 20 U/L (ref 0–37)
Albumin: 4.6 g/dL (ref 3.5–5.2)
Alkaline Phosphatase: 44 U/L (ref 39–117)
BUN: 10 mg/dL (ref 6–23)
CO2: 30 mEq/L (ref 19–32)
Calcium: 9.6 mg/dL (ref 8.4–10.5)
Chloride: 102 mEq/L (ref 96–112)
Creatinine, Ser: 0.7 mg/dL (ref 0.40–1.20)
GFR: 105 mL/min (ref >60.00)
Glucose, Bld: 92 mg/dL (ref 70–99)
Potassium: 3.8 mEq/L (ref 3.5–5.1)
Sodium: 138 mEq/L (ref 135–145)
Total Bilirubin: 0.4 mg/dL (ref 0.2–1.2)
Total Protein: 7.4 g/dL (ref 6.0–8.3)

## 2021-04-22 LAB — LIPID PANEL
Cholesterol: 185 mg/dL (ref 0–200)
HDL: 59.9 mg/dL (ref >39.00)
LDL Cholesterol: 109 mg/dL — ABNORMAL HIGH (ref 0–99)
NonHDL: 125.05
Total CHOL/HDL Ratio: 3
Triglycerides: 78 mg/dL (ref 0.0–149.0)
VLDL: 15.6 mg/dL (ref 0.0–40.0)

## 2021-04-22 LAB — VITAMIN D 25 HYDROXY (VIT D DEFICIENCY, FRACTURES): VITD: 32.24 ng/mL (ref 30.00–100.00)

## 2021-04-22 MED ORDER — VALACYCLOVIR HCL 1 G PO TABS: 1000.0000 mg | ORAL_TABLET | Freq: Three times a day (TID) | ORAL | 2 refills | Status: DC

## 2021-04-22 MED ORDER — SERTRALINE HCL 50 MG PO TABS: 50.0000 mg | ORAL_TABLET | Freq: Every day | ORAL | 3 refills | Status: DC

## 2021-04-22 MED ORDER — LORAZEPAM 0.5 MG PO TABS: 0.5000 mg | ORAL_TABLET | Freq: Two times a day (BID) | ORAL | 2 refills | Status: DC | PRN

## 2021-04-22 NOTE — Assessment & Plan Note (Signed)
History of vitamin D deficiency. She takes an over the counter vitamin D supplement on days that she remembers. Check vitamin D levels today and adjust regimen based on results.  ?

## 2021-04-22 NOTE — Patient Instructions (Signed)
It was great to see you! ? ?We are  checking labs today and will let you know the results. I have sent refills to your pharmacy.  ? ?Let's follow-up in 1 year, sooner if you have concerns. ? ?If a referral was placed today, you will be contacted for an appointment. Please note that routine referrals can sometimes take up to 3-4 weeks to process. Please call our office if you haven't heard anything after this time frame. ? ?Take care, ? ?Vance Peper, NP ? ?

## 2021-04-22 NOTE — Assessment & Plan Note (Signed)
History of PSVT, denies any palpitations or acute symptoms. Follow-up with any concerns.  ?

## 2021-04-22 NOTE — Assessment & Plan Note (Signed)
Chronic, stable. See plan for anxiety.  ?

## 2021-04-22 NOTE — Assessment & Plan Note (Signed)
Chronic, stable. PHQ 9 and GAD 7 score are a 4 today. She denies any side effects from sertraline or lorazepam. She rarely needs to take it, a 60 day supply lasted more than a year. Refill sent on sertraline '50mg'$  daily and lorazepam 0.'5mg'$  BID prn panic attack. PDMP reviewed. Follow-up in 1 year or sooner with concerns.  ?

## 2021-04-29 ENCOUNTER — Encounter: Payer: Self-pay | Admitting: Nurse Practitioner

## 2021-04-29 DIAGNOSIS — Z3169 Encounter for other general counseling and advice on procreation: Secondary | ICD-10-CM

## 2021-06-06 ENCOUNTER — Encounter: Payer: Self-pay | Admitting: Nurse Practitioner

## 2021-06-06 DIAGNOSIS — Z3169 Encounter for other general counseling and advice on procreation: Secondary | ICD-10-CM

## 2021-07-04 ENCOUNTER — Encounter: Payer: Self-pay | Admitting: Nurse Practitioner

## 2021-07-04 ENCOUNTER — Ambulatory Visit (INDEPENDENT_AMBULATORY_CARE_PROVIDER_SITE_OTHER): Payer: PRIVATE HEALTH INSURANCE | Admitting: Nurse Practitioner

## 2021-07-04 VITALS — BP 112/70 | HR 65 | Temp 97.6°F | Ht 64.0 in | Wt 147.8 lb

## 2021-07-04 DIAGNOSIS — R21 Rash and other nonspecific skin eruption: Secondary | ICD-10-CM | POA: Diagnosis not present

## 2021-07-04 MED ORDER — CLOTRIMAZOLE-BETAMETHASONE 1-0.05 % EX CREA: 1.0000 "application " | TOPICAL_CREAM | Freq: Every day | CUTANEOUS | 0 refills | Status: DC

## 2021-07-04 NOTE — Progress Notes (Signed)
   Acute Office Visit  Subjective:     Patient ID: Amber Case, female    DOB: 1975-06-27, 46 y.o.   MRN: [id number]  Chief Complaint  Patient presents with   Acute Visit    C/o having a rash on RT breast x 7 days. Has been putting Cortizone cream on rash.     HPI Patient is in today for rash on her right breast for 7 days.  RASH  Duration: 7 days  Location:  right breast   Itching: yes Burning: yes Redness: yes Oozing: no Scaling: no Blisters: no Painful: no Fevers: no Change in detergents/soaps/personal care products: no Recent illness: no Recent travel:no History of same: no Context: worse Alleviating factors: hydrocortisone cream Treatments attempted:hydrocortisone cream Shortness of breath: no  Throat/tongue swelling: no Myalgias/arthralgias: no  ROS See pertinent positives and negatives per HPI.     Objective:    BP 112/70   Pulse 65   Temp 97.6 F (36.4 C) (Temporal)   Ht '5\' 4"'$  (1.626 m)   Wt 147 lb 12.8 oz (67 kg)   SpO2 96%   BMI 25.37 kg/m    Physical Exam Vitals and nursing note reviewed.  Constitutional:      General: She is not in acute distress.    Appearance: Normal appearance.  HENT:     Head: Normocephalic.  Eyes:     Conjunctiva/sclera: Conjunctivae normal.  Pulmonary:     Effort: Pulmonary effort is normal.  Chest:  Breasts:    Right: Skin change (rash) present. No swelling, mass or tenderness.  Musculoskeletal:     Cervical back: Normal range of motion.  Skin:    General: Skin is warm.     Findings: Rash (right breast, erythematous, slightly raised) present.  Neurological:     General: No focal deficit present.     Mental Status: She is alert and oriented to person, place, and time.  Psychiatric:        Mood and Affect: Mood normal.        Behavior: Behavior normal.        Thought Content: Thought content normal.        Judgment: Judgment normal.       Assessment & Plan:   Problem List Items Addressed This  Visit       Musculoskeletal and Integument   Rash - Primary    Rash to right breast for 1 week. Describes itching, no pain. No breast mass palpated on exam. Last mammogram for right breast in January 2023 normal. Most likely fungal/contact dermatitis. Will treat with lotrisone daily. Wash area twice a day with warm water and pat dry. Change clothes after exercising. Follow-up in 1 week if not improving.        Meds ordered this encounter  Medications   clotrimazole-betamethasone (LOTRISONE) cream    Sig: Apply 1 application  topically daily.    Dispense:  30 g    Refill:  0    Return if symptoms worsen or fail to improve.  Charyl Dancer, NP

## 2021-07-04 NOTE — Assessment & Plan Note (Signed)
Rash to right breast for 1 week. Describes itching, no pain. No breast mass palpated on exam. Last mammogram for right breast in January 2023 normal. Most likely fungal/contact dermatitis. Will treat with lotrisone daily. Wash area twice a day with warm water and pat dry. Change clothes after exercising. Follow-up in 1 week if not improving.

## 2021-07-04 NOTE — Patient Instructions (Signed)
It was great to see you!  Start lotrisone cream daily on the rash. Wash the area twice a day with soap and water and pat dry. If not improving next week, reach out and let me know  Let's follow-up if symptoms worsen or don't improve.  Take care,  Vance Peper, NP

## 2021-09-10 NOTE — Progress Notes (Signed)
Acute Office Visit  Subjective:     Patient ID: Amber Case, female    DOB: 09-10-1975, 46 y.o.   MRN: [id number]  Chief Complaint  Patient presents with   Back Pain    Pt c/o lower back pian that starts with an ache-like pain and lasts all day x1 mo    HPI Patient is in today for back pain. It started about a year ago, however over the last month it has been worsening and almost daily.  Amber Case states that the pain is worse in the morning when Amber Case wakes up and laying down at night.  BACK PAIN  Duration:  1 year, seemed to worsen over the past month.  Mechanism of injury: no trauma Location: midline and low back Onset: gradual Severity: mild to moderate Quality: ache Frequency: intermittent Radiation: none Aggravating factors: laying on her back Alleviating factors:  patch and NSAIDs Status: worse Treatments attempted: rest, heat, ibuprofen, HEP, and OMM  Relief with NSAIDs?: mild Nighttime pain:  yes Paresthesias / decreased sensation:  no Bowel / bladder incontinence:  no Fevers:  no Dysuria / urinary frequency:  no   ROS See pertinent positives and negatives per HPI.     Objective:    BP 98/70   Pulse 63   Wt 148 lb 9.6 oz (67.4 kg)   SpO2 98%   BMI 25.51 kg/m    Physical Exam Vitals and nursing note reviewed.  Constitutional:      General: Amber Case is not in acute distress.    Appearance: Normal appearance.  HENT:     Head: Normocephalic.  Eyes:     Conjunctiva/sclera: Conjunctivae normal.  Cardiovascular:     Rate and Rhythm: Normal rate and regular rhythm.     Pulses: Normal pulses.     Heart sounds: Normal heart sounds.  Pulmonary:     Effort: Pulmonary effort is normal.     Breath sounds: Normal breath sounds.  Musculoskeletal:        General: No swelling, tenderness or deformity. Normal range of motion.     Cervical back: Normal range of motion.     Right lower leg: No edema.     Left lower leg: No edema.     Comments: Straight leg raise  negative bilaterally  Skin:    General: Skin is warm.  Neurological:     General: No focal deficit present.     Mental Status: Amber Case is alert and oriented to person, place, and time.  Psychiatric:        Mood and Affect: Mood normal.        Behavior: Behavior normal.        Thought Content: Thought content normal.        Judgment: Judgment normal.      Assessment & Plan:   Problem List Items Addressed This Visit       Other   Chronic midline low back pain without sciatica - Primary    Amber Case has been experiencing low back pain intermittently over the last year, however this is worsened over the last month.  No red flags on exam today.  We will have her continue Motrin and lidocaine patches as needed for pain.  We will also have her start Flexeril as needed at bedtime for muscle tightness.  Discussed that this may make her sleepy and do not drive after taking.  Also provided her stretches to do daily.  Follow-up in 4 weeks, if still having ongoing  pain we will check x-ray at this point.      Relevant Medications   cyclobenzaprine (FLEXERIL) 10 MG tablet    Meds ordered this encounter  Medications   cyclobenzaprine (FLEXERIL) 10 MG tablet    Sig: Take 1 tablet (10 mg total) by mouth 3 (three) times daily as needed for muscle spasms.    Dispense:  30 tablet    Refill:  0    Return in about 4 weeks (around 10/09/2021), or if symptoms worsen or fail to improve, for 4-6 weeks .  Charyl Dancer, NP

## 2021-09-11 ENCOUNTER — Encounter: Payer: Self-pay | Admitting: Nurse Practitioner

## 2021-09-11 ENCOUNTER — Ambulatory Visit (INDEPENDENT_AMBULATORY_CARE_PROVIDER_SITE_OTHER): Payer: PRIVATE HEALTH INSURANCE | Admitting: Nurse Practitioner

## 2021-09-11 VITALS — BP 98/70 | HR 63 | Wt 148.6 lb

## 2021-09-11 DIAGNOSIS — M545 Low back pain, unspecified: Secondary | ICD-10-CM

## 2021-09-11 DIAGNOSIS — G8929 Other chronic pain: Secondary | ICD-10-CM

## 2021-09-11 MED ORDER — CYCLOBENZAPRINE HCL 10 MG PO TABS: 10.0000 mg | ORAL_TABLET | Freq: Three times a day (TID) | ORAL | 0 refills | Status: DC | PRN

## 2021-09-11 NOTE — Patient Instructions (Addendum)
It was great to see you!  Keep doing the patches and motrin as needed. I have attached some stretches that you can do daily to help your back. You can also take flexeril which is a muscle relaxer as needed. This may make you sleepy.   Let's follow-up in 4-6 weeks, sooner if you have concerns.  If a referral was placed today, you will be contacted for an appointment. Please note that routine referrals can sometimes take up to 3-4 weeks to process. Please call our office if you haven't heard anything after this time frame.  Take care,  Vance Peper, NP

## 2021-09-12 DIAGNOSIS — G8929 Other chronic pain: Secondary | ICD-10-CM | POA: Insufficient documentation

## 2021-09-12 NOTE — Assessment & Plan Note (Signed)
She has been experiencing low back pain intermittently over the last year, however this is worsened over the last month.  No red flags on exam today.  We will have her continue Motrin and lidocaine patches as needed for pain.  We will also have her start Flexeril as needed at bedtime for muscle tightness.  Discussed that this may make her sleepy and do not drive after taking.  Also provided her stretches to do daily.  Follow-up in 4 weeks, if still having ongoing pain we will check x-ray at this point.

## 2021-10-13 ENCOUNTER — Encounter: Payer: Self-pay | Admitting: Nurse Practitioner

## 2021-10-13 ENCOUNTER — Ambulatory Visit (INDEPENDENT_AMBULATORY_CARE_PROVIDER_SITE_OTHER): Payer: 59 | Admitting: Nurse Practitioner

## 2021-10-13 VITALS — BP 121/78 | HR 113 | Temp 98.2°F | Wt 152.4 lb

## 2021-10-13 DIAGNOSIS — H6690 Otitis media, unspecified, unspecified ear: Secondary | ICD-10-CM | POA: Diagnosis not present

## 2021-10-13 DIAGNOSIS — R051 Acute cough: Secondary | ICD-10-CM

## 2021-10-13 MED ORDER — DOXYCYCLINE HYCLATE 100 MG PO TABS: 100.0000 mg | ORAL_TABLET | Freq: Two times a day (BID) | ORAL | 0 refills | Status: DC

## 2021-10-13 MED ORDER — HYDROCOD POLI-CHLORPHE POLI ER 10-8 MG/5ML PO SUER: 5.0000 mL | Freq: Two times a day (BID) | ORAL | 0 refills | Status: DC | PRN

## 2021-10-13 NOTE — Progress Notes (Signed)
Acute Office Visit  Subjective:     Patient ID: Amber Case, female    DOB: 1975-02-09, 46 y.o.   MRN: [id number]  Chief Complaint  Patient presents with   Cough    Coughing for about month. Friday started to get worse at night.Covid test was taken yesterday-Neg. No Productive cough.Took Nyquil for cough.Urgent care stated she had bronchitis.    HPI Patient is in today for ongoing cough.  This started about 4 weeks ago, she thought it was allergies at first.  The cough then worsened and she went to urgent care 2 weeks ago and was diagnosed with bronchitis.  She was prescribed a prednisone taper and states that this helped while she was taking it, however then her symptoms worsened.  Then on Friday, she noticed that she was starting to get some more nasal congestion, sinus pressure, ear pressure.  She denies sore throat, ear pain, fevers.  She has been taking Zyrtec and NyQuil, however she is having an ongoing cough that is worse at night. Home covid-19 test was negative yesterday.   ROS See pertinent positives and negatives per HPI.     Objective:    BP 121/78 (BP Location: Left Arm, Patient Position: Sitting, Cuff Size: Large)   Pulse (!) 113   Temp 98.2 F (36.8 C) (Oral)   Wt 152 lb 6.4 oz (69.1 kg)   SpO2 97%   BMI 26.16 kg/m     Physical Exam Vitals and nursing note reviewed.  Constitutional:      General: She is not in acute distress.    Appearance: Normal appearance.  HENT:     Head: Normocephalic.     Right Ear: Tympanic membrane, ear canal and external ear normal.     Left Ear: External ear normal. Tympanic membrane is erythematous.     Nose:     Right Sinus: No maxillary sinus tenderness or frontal sinus tenderness.     Left Sinus: No maxillary sinus tenderness or frontal sinus tenderness.     Mouth/Throat:     Pharynx: Posterior oropharyngeal erythema present. No oropharyngeal exudate.  Eyes:     Conjunctiva/sclera: Conjunctivae normal.  Cardiovascular:      Rate and Rhythm: Normal rate and regular rhythm.     Pulses: Normal pulses.     Heart sounds: Normal heart sounds.  Pulmonary:     Effort: Pulmonary effort is normal.     Breath sounds: Normal breath sounds.  Musculoskeletal:     Cervical back: Normal range of motion and neck supple. No tenderness.  Lymphadenopathy:     Cervical: No cervical adenopathy.  Skin:    General: Skin is warm.  Neurological:     General: No focal deficit present.     Mental Status: She is alert and oriented to person, place, and time.  Psychiatric:        Mood and Affect: Mood normal.        Behavior: Behavior normal.        Thought Content: Thought content normal.        Judgment: Judgment normal.       Assessment & Plan:   Problem List Items Addressed This Visit   None Visit Diagnoses     Acute cough    -  Primary   Ongoing x4 weeks.  Encourage fluids, rest. Tussionex prn cough. PDMP reviewed. Start doxycyline as noted below. F/U if not improving.    Acute otitis media, unspecified otitis media type  We will treat with doxycycline 100 mg twice daily x10 days.  Follow-up if symptoms worsen or do not improve.   Relevant Medications   doxycycline (VIBRA-TABS) 100 MG tablet       Meds ordered this encounter  Medications   doxycycline (VIBRA-TABS) 100 MG tablet    Sig: Take 1 tablet (100 mg total) by mouth 2 (two) times daily.    Dispense:  20 tablet    Refill:  0   chlorpheniramine-HYDROcodone (TUSSIONEX) 10-8 MG/5ML    Sig: Take 5 mLs by mouth every 12 (twelve) hours as needed for cough.    Dispense:  70 mL    Refill:  0    Return if symptoms worsen or fail to improve.  Charyl Dancer, NP

## 2021-10-13 NOTE — Patient Instructions (Signed)
It was great to see you!  Start doxycycline antibiotic twice a day for 10 days. Take this with food.   You can take the cough syrup twice as needed, this may make you sleepy.   You can continue the zyrtec and nyquil. Drink plenty of fluids.   Let's follow-up if your symptoms worsen or don't improve.   Take care,  Vance Peper, NP

## 2021-10-22 ENCOUNTER — Other Ambulatory Visit: Payer: Self-pay | Admitting: Nurse Practitioner

## 2021-10-22 ENCOUNTER — Encounter: Payer: Self-pay | Admitting: Nurse Practitioner

## 2021-10-22 DIAGNOSIS — F410 Panic disorder [episodic paroxysmal anxiety] without agoraphobia: Secondary | ICD-10-CM

## 2021-10-23 MED ORDER — AMOXICILLIN-POT CLAVULANATE 875-125 MG PO TABS: 1.0000 | ORAL_TABLET | Freq: Two times a day (BID) | ORAL | 0 refills | Status: DC

## 2021-10-29 ENCOUNTER — Ambulatory Visit (INDEPENDENT_AMBULATORY_CARE_PROVIDER_SITE_OTHER): Payer: 59

## 2021-10-29 ENCOUNTER — Encounter: Payer: Self-pay | Admitting: Nurse Practitioner

## 2021-10-29 ENCOUNTER — Ambulatory Visit (INDEPENDENT_AMBULATORY_CARE_PROVIDER_SITE_OTHER): Payer: 59 | Admitting: Nurse Practitioner

## 2021-10-29 VITALS — BP 110/72 | HR 84 | Temp 98.1°F | Ht 64.0 in | Wt 152.0 lb

## 2021-10-29 DIAGNOSIS — R052 Subacute cough: Secondary | ICD-10-CM

## 2021-10-29 MED ORDER — QVAR REDIHALER 40 MCG/ACT IN AERB: 2.0000 | INHALATION_SPRAY | Freq: Two times a day (BID) | RESPIRATORY_TRACT | 1 refills | Status: DC

## 2021-10-29 NOTE — Patient Instructions (Signed)
It was great to see you!  Start Qvar inhaler 1-2 puffs twice a day. Rinse your mouth out after using these inhalers  Let's follow-up in 4 weeks, sooner if you have concerns.  If a referral was placed today, you will be contacted for an appointment. Please note that routine referrals can sometimes take up to 3-4 weeks to process. Please call our office if you haven't heard anything after this time frame.  Take care,  Vance Peper, NP

## 2021-10-29 NOTE — Assessment & Plan Note (Signed)
Ongoing cough with wheezing since having bronchitis 2 months ago.  Since she is still having this cough, will check chest x-ray today.  Concern for adult onset asthma.  We will have her start Qvar inhaler twice a day, instructed her to rinse her mouth out with water after using.  She can also continue to use the albuterol inhaler as needed.  Follow-up in 4 weeks, sooner if symptoms worsen or with any concerns.

## 2021-10-29 NOTE — Progress Notes (Signed)
   Acute Office Visit  Subjective:     Patient ID: Amber Case, female    DOB: [birth year], 46 y.o.   MRN: [id number]  Chief Complaint  Patient presents with   Acute Visit    Pt c/o persistent coughing and coughing when taking deep breaths x 1 month    HPI Patient is in today for persistent coughing and wheezing since her bronchitis started 2 months ago.  She has completed 2 rounds of antibiotics and a round of prednisone.  She states that she is feeling better overall, just having this cough.  She states that she started using her albuterol inhaler 4 times a day which has helped her symptoms, however she is needing to use it this often.  She denies chest tightness, sputum production, fevers, nasal congestion.  She is still taking allergy medication, currently it is Human resources officer.  ROS See pertinent positives and negatives per HPI.     Objective:    BP 110/72 (BP Location: Right Arm, Patient Position: Sitting, Cuff Size: Normal)   Pulse 84   Temp 98.1 F (36.7 C) (Temporal)   Ht '5\' 4"'$  (1.626 m)   Wt 152 lb (68.9 kg)   SpO2 99%   BMI 26.09 kg/m    Physical Exam Vitals and nursing note reviewed.  Constitutional:      General: She is not in acute distress.    Appearance: Normal appearance.  HENT:     Head: Normocephalic.  Eyes:     Conjunctiva/sclera: Conjunctivae normal.  Cardiovascular:     Rate and Rhythm: Normal rate and regular rhythm.     Pulses: Normal pulses.     Heart sounds: Normal heart sounds.  Pulmonary:     Effort: Pulmonary effort is normal.     Breath sounds: Normal breath sounds.  Musculoskeletal:     Cervical back: Normal range of motion.  Skin:    General: Skin is warm.  Neurological:     General: No focal deficit present.     Mental Status: She is alert and oriented to person, place, and time.  Psychiatric:        Mood and Affect: Mood normal.        Behavior: Behavior normal.        Thought Content: Thought content normal.        Judgment:  Judgment normal.       Assessment & Plan:   Problem List Items Addressed This Visit       Other   Subacute cough - Primary    Ongoing cough with wheezing since having bronchitis 2 months ago.  Since she is still having this cough, will check chest x-ray today.  Concern for adult onset asthma.  We will have her start Qvar inhaler twice a day, instructed her to rinse her mouth out with water after using.  She can also continue to use the albuterol inhaler as needed.  Follow-up in 4 weeks, sooner if symptoms worsen or with any concerns.      Relevant Orders   DG Chest 2 View    Meds ordered this encounter  Medications   beclomethasone (QVAR REDIHALER) 40 MCG/ACT inhaler    Sig: Inhale 2 puffs into the lungs 2 (two) times daily.    Dispense:  1 each    Refill:  1    Return in about 4 weeks (around 11/26/2021) for cough.  Charyl Dancer, NP

## 2021-10-30 ENCOUNTER — Ambulatory Visit: Payer: 59 | Admitting: Nurse Practitioner

## 2021-10-30 NOTE — Progress Notes (Unsigned)
Initial visit for ongoing cough with wheezing since having bronchitis 2 months ago.

## 2021-11-27 ENCOUNTER — Ambulatory Visit (INDEPENDENT_AMBULATORY_CARE_PROVIDER_SITE_OTHER): Payer: 59 | Admitting: Nurse Practitioner

## 2021-11-27 ENCOUNTER — Encounter: Payer: Self-pay | Admitting: Nurse Practitioner

## 2021-11-27 VITALS — BP 110/71 | HR 81 | Temp 96.0°F | Wt 154.6 lb

## 2021-11-27 DIAGNOSIS — Z23 Encounter for immunization: Secondary | ICD-10-CM | POA: Diagnosis not present

## 2021-11-27 DIAGNOSIS — R052 Subacute cough: Secondary | ICD-10-CM | POA: Diagnosis not present

## 2021-11-27 NOTE — Progress Notes (Signed)
   Established Patient Office Visit  Subjective   Patient ID: Amber Case, female    DOB: Sep 25, 1975  Age: 46 y.o. MRN: [id number]  Chief Complaint  Patient presents with   Follow-up    4 wk f/u cough. Pt states inhalers has help with cough.     HPI  Amber Case is here to follow-up on her ongoing cough after bronchitis. Since starting the Qvar inhaler twice a day, she states her symptoms have improved. She denies shortness of breath, wheezing, and coughing. She has not had to use the albuterol inhaler since starting the Qvar. She states she tried to stop the inhaler one day, but noticed her cough started to return.     ROS See pertinent positives and negatives per HPI.    Objective:     BP 110/71   Pulse 81   Temp (!) 96 F (35.6 C) (Temporal)   Wt 154 lb 9.6 oz (70.1 kg)   SpO2 98%   BMI 26.54 kg/m    Physical Exam Vitals and nursing note reviewed.  Constitutional:      General: She is not in acute distress.    Appearance: Normal appearance.  HENT:     Head: Normocephalic.  Eyes:     Conjunctiva/sclera: Conjunctivae normal.  Cardiovascular:     Rate and Rhythm: Normal rate and regular rhythm.     Pulses: Normal pulses.     Heart sounds: Normal heart sounds.  Pulmonary:     Effort: Pulmonary effort is normal.     Breath sounds: Normal breath sounds.  Musculoskeletal:     Cervical back: Normal range of motion.  Skin:    General: Skin is warm.  Neurological:     General: No focal deficit present.     Mental Status: She is alert and oriented to person, place, and time.  Psychiatric:        Mood and Affect: Mood normal.        Behavior: Behavior normal.        Thought Content: Thought content normal.        Judgment: Judgment normal.     The 10-year ASCVD risk score (Arnett DK, et al., 2019) is: 0.5%    Assessment & Plan:   Problem List Items Addressed This Visit       Other   Subacute cough - Primary    Cough has improved and gone away  after starting Qvar inhaler 2 puffs BID. Most likely from adult onset asthma after bronchitis infection a few months ago. Continue this inhaler. After a few weeks, she can trial going down to 1 puff twice a day and then stop to see if her symptoms have improved. Otherwise, can continue Qvar as prescribed. Follow-up in 5 months, sooner with concerns.       Other Visit Diagnoses     Need for influenza vaccination       Flu vaccine given today   Relevant Orders   Flu Vaccine QUAD 6+ mos PF IM (Fluarix Quad PF) (Completed)       Return in about 5 months (around 04/28/2022) for CPE.    Charyl Dancer, NP

## 2021-11-27 NOTE — Assessment & Plan Note (Signed)
Cough has improved and gone away after starting Qvar inhaler 2 puffs BID. Most likely from adult onset asthma after bronchitis infection a few months ago. Continue this inhaler. After a few weeks, she can trial going down to 1 puff twice a day and then stop to see if her symptoms have improved. Otherwise, can continue Qvar as prescribed. Follow-up in 5 months, sooner with concerns.

## 2021-11-27 NOTE — Patient Instructions (Signed)
It was great to see you!  Keep taking the qvar inhaler twice a day for a few more weeks. Then you can trial cutting back to 1 puff twice a day for a few days and then stopping. If at any point your symptoms worsen, you can restart the inhaler.   Let's follow-up in 4 months, sooner if you have concerns.  If a referral was placed today, you will be contacted for an appointment. Please note that routine referrals can sometimes take up to 3-4 weeks to process. Please call our office if you haven't heard anything after this time frame.  Take care,  Vance Peper, NP

## 2021-12-05 ENCOUNTER — Encounter: Payer: Self-pay | Admitting: Nurse Practitioner

## 2021-12-15 ENCOUNTER — Encounter: Payer: Self-pay | Admitting: Nurse Practitioner

## 2022-02-02 LAB — HM MAMMOGRAPHY

## 2022-03-26 LAB — HM PAP SMEAR: HM Pap smear: NORMAL

## 2022-03-26 LAB — RESULTS CONSOLE HPV: CHL HPV: NEGATIVE

## 2022-04-26 NOTE — Progress Notes (Unsigned)
There were no vitals taken for this visit.   Subjective:    Patient ID: Amber Case, female    DOB: 18-Jun-1975, 47 y.o.   MRN: [id number]  CC: No chief complaint on file.   HPI: Amber Case is a 47 y.o. female presenting on 04/28/2022 for comprehensive medical examination. Current medical complaints include:{Blank single:19197::"none","***"}  She currently lives with: Menopausal Symptoms: {Blank single:19197::"yes","no"}  Depression and Anxiety Screen done today and results listed below:     04/22/2021    9:47 AM 08/01/2020   11:03 AM 10/12/2018    8:55 AM 12/30/2017    8:36 AM 12/02/2017   10:11 AM  Depression screen PHQ 2/9  Decreased Interest 0 0 0 1 0  Down, Depressed, Hopeless 0 0 0 0 1  PHQ - 2 Score 0 0 0 1 1  Altered sleeping 1  2 1    Tired, decreased energy 1  1 1    Change in appetite 1  0 0   Feeling bad or failure about yourself  0  0 1   Trouble concentrating 1  0 1   Moving slowly or fidgety/restless 0  0 0   Suicidal thoughts 0  0 0   PHQ-9 Score 4  3 5    Difficult doing work/chores Somewhat difficult  Somewhat difficult Somewhat difficult       04/22/2021    9:47 AM 10/12/2018    9:08 AM 12/30/2017    8:36 AM 12/02/2017   10:12 AM  GAD 7 : Generalized Anxiety Score  Nervous, Anxious, on Edge 1 1 1 2   Control/stop worrying 1 0 1 2  Worry too much - different things 1 0 1 2  Trouble relaxing 0 0 1 2  Restless 0 0 0 1  Easily annoyed or irritable 1 1 0 2  Afraid - awful might happen 0 0 0 1  Total GAD 7 Score 4 2 4 12   Anxiety Difficulty Somewhat difficult Somewhat difficult Somewhat difficult Somewhat difficult    The patient {has/does not have:19849} a history of falls. I {did/did not:19850} complete a risk assessment for falls. A plan of care for falls {was/was not:19852} documented.   Past Medical History:  Past Medical History:  Diagnosis Date   Abdominal hernia    Anxiety    Gestational diabetes    First pregnancy   H. pylori infection     History of abnormal cervical Pap smear    Irritable bowel syndrome    Palpitations    SVT (supraventricular tachycardia)    Tubular adenoma of colon     Surgical History:  Past Surgical History:  Procedure Laterality Date   CESAREAN SECTION     DOPPLER ECHOCARDIOGRAPHY  05/24/2009   trace pulmonary regurgitation.  trace tricuspid regurgitation. LV normal.  Trace mitral regurgitation.     heart monitor     NO RESULT READ   nuclear testing  08/30/2009   NM MYOCAR MULTI w/SPECT; Mild septal hypokinesis.  EF 63%   PLACEMENT OF BREAST IMPLANTS Bilateral     Medications:  Current Outpatient Medications on File Prior to Visit  Medication Sig   amoxicillin-clavulanate (AUGMENTIN) 875-125 MG tablet Take 1 tablet by mouth 2 (two) times daily.   beclomethasone (QVAR REDIHALER) 40 MCG/ACT inhaler Inhale 2 puffs into the lungs 2 (two) times daily.   LORazepam (ATIVAN) 0.5 MG tablet TAKE 1 TABLET (0.5 MG TOTAL) BY MOUTH 2 (TWO) TIMES DAILY AS NEEDED (SEVERE ANXIETY/PANIC).   Multiple Vitamin (  MULTIVITAMIN) tablet Take 1 tablet by mouth daily.   sertraline (ZOLOFT) 50 MG tablet Take 1 tablet (50 mg total) by mouth daily.   valACYclovir (VALTREX) 1000 MG tablet Take 1 tablet (1,000 mg total) by mouth 3 (three) times daily. For 10 days as needed for outbreak   VENTOLIN HFA 108 (90 Base) MCG/ACT inhaler SMARTSIG:2 Puff(s) Via Inhaler 4 Times Daily PRN   No current facility-administered medications on file prior to visit.    Allergies:  Allergies  Allergen Reactions   Sulfa Antibiotics Hives   Ofloxacin Itching    Hives; Sweating   Sulfonamide Derivatives     Social History:  Social History   Socioeconomic History   Marital status: Married    Spouse name: Not on file   Number of children: 3   Years of education: Not on file   Highest education level: Not on file  Occupational History   Occupation: homemaker  Tobacco Use   Smoking status: Never   Smokeless tobacco: Never   Vaping Use   Vaping Use: Never used  Substance and Sexual Activity   Alcohol use: No   Drug use: No   Sexual activity: Yes    Partners: Male    Birth control/protection: Condom  Other Topics Concern   Not on file  Social History Narrative   Her daughter has a chromosome 15 defect   Social Determinants of Radio broadcast assistant Strain: Not on file  Food Insecurity: Not on file  Transportation Needs: Not on file  Physical Activity: Not on file  Stress: Not on file  Social Connections: Not on file  Intimate Partner Violence: Not on file   Social History   Tobacco Use  Smoking Status Never  Smokeless Tobacco Never   Social History   Substance and Sexual Activity  Alcohol Use No    Family History:  Family History  Problem Relation Age of Onset   Heart murmur Mother    Colitis Mother    Irritable bowel syndrome Mother    High blood pressure Brother    Irritable bowel syndrome Brother    Heart attack Maternal Grandmother    Heart disease Maternal Grandmother    Prostate cancer Paternal Grandfather    Diabetes Maternal Aunt    Heart disease Maternal Aunt    Diabetes Maternal Uncle    Diabetes Paternal Aunt    Diabetes Paternal Uncle    Stomach cancer Maternal Grandfather     Past medical history, surgical history, medications, allergies, family history and social history reviewed with patient today and changes made to appropriate areas of the chart.   ROS All other ROS negative except what is listed above and in the HPI.      Objective:    There were no vitals taken for this visit.  Wt Readings from Last 3 Encounters:  11/27/21 154 lb 9.6 oz (70.1 kg)  10/29/21 152 lb (68.9 kg)  10/13/21 152 lb 6.4 oz (69.1 kg)    Physical Exam  Results for orders placed or performed in visit on 04/22/21  CBC with Differential/Platelet  Result Value Ref Range   WBC 5.8 4.0 - 10.5 K/uL   RBC 4.11 3.87 - 5.11 Mil/uL   Hemoglobin 13.1 12.0 - 15.0 g/dL   HCT 38.9  36.0 - 46.0 %   MCV 94.6 78.0 - 100.0 fl   MCHC 33.6 30.0 - 36.0 g/dL   RDW 13.0 11.5 - 15.5 %   Platelets 256.0 150.0 - 400.0  K/uL   Neutrophils Relative % 45.7 43.0 - 77.0 %   Lymphocytes Relative 43.3 12.0 - 46.0 %   Monocytes Relative 7.6 3.0 - 12.0 %   Eosinophils Relative 2.8 0.0 - 5.0 %   Basophils Relative 0.6 0.0 - 3.0 %   Neutro Abs 2.6 1.4 - 7.7 K/uL   Lymphs Abs 2.5 0.7 - 4.0 K/uL   Monocytes Absolute 0.4 0.1 - 1.0 K/uL   Eosinophils Absolute 0.2 0.0 - 0.7 K/uL   Basophils Absolute 0.0 0.0 - 0.1 K/uL  Comprehensive metabolic panel  Result Value Ref Range   Sodium 138 135 - 145 mEq/L   Potassium 3.8 3.5 - 5.1 mEq/L   Chloride 102 96 - 112 mEq/L   CO2 30 19 - 32 mEq/L   Glucose, Bld 92 70 - 99 mg/dL   BUN 10 6 - 23 mg/dL   Creatinine, Ser 0.70 0.40 - 1.20 mg/dL   Total Bilirubin 0.4 0.2 - 1.2 mg/dL   Alkaline Phosphatase 44 39 - 117 U/L   AST 20 0 - 37 U/L   ALT 12 0 - 35 U/L   Total Protein 7.4 6.0 - 8.3 g/dL   Albumin 4.6 3.5 - 5.2 g/dL   GFR 105.00 >60.00 mL/min   Calcium 9.6 8.4 - 10.5 mg/dL  VITAMIN D 25 Hydroxy (Vit-D Deficiency, Fractures)  Result Value Ref Range   VITD 32.24 30.00 - 100.00 ng/mL  Lipid panel  Result Value Ref Range   Cholesterol 185 0 - 200 mg/dL   Triglycerides 78.0 0.0 - 149.0 mg/dL   HDL 59.90 >39.00 mg/dL   VLDL 15.6 0.0 - 40.0 mg/dL   LDL Cholesterol 109 (H) 0 - 99 mg/dL   Total CHOL/HDL Ratio 3    NonHDL 125.05       Assessment & Plan:   Problem List Items Addressed This Visit   None    Follow up plan: No follow-ups on file.   LABORATORY TESTING:  - Pap smear: up to date  IMMUNIZATIONS:   - Tdap: Tetanus vaccination status reviewed: last tetanus booster within 10 years. - Influenza: Up to date - Pneumovax: Not applicable - Prevnar: Not applicable - HPV: Not applicable - Zostavax vaccine: Not applicable  SCREENING: -Mammogram: Up to date  - Colonoscopy: Up to date  - Bone Density: Not applicable   PATIENT  COUNSELING:   Advised to take 1 mg of folate supplement per day if capable of pregnancy.   Sexuality: Discussed sexually transmitted diseases, partner selection, use of condoms, avoidance of unintended pregnancy  and contraceptive alternatives.   Advised to avoid cigarette smoking.  I discussed with the patient that most people either abstain from alcohol or drink within safe limits (<=14/week and <=4 drinks/occasion for males, <=7/weeks and <= 3 drinks/occasion for females) and that the risk for alcohol disorders and other health effects rises proportionally with the number of drinks per week and how often a drinker exceeds daily limits.  Discussed cessation/primary prevention of drug use and availability of treatment for abuse.   Diet: Encouraged to adjust caloric intake to maintain  or achieve ideal body weight, to reduce intake of dietary saturated fat and total fat, to limit sodium intake by avoiding high sodium foods and not adding table salt, and to maintain adequate dietary potassium and calcium preferably from fresh fruits, vegetables, and low-fat dairy products.    stressed the importance of regular exercise  Injury prevention: Discussed safety belts, safety helmets, smoke detector, smoking near bedding  or upholstery.   Dental health: Discussed importance of regular tooth brushing, flossing, and dental visits.    NEXT PREVENTATIVE PHYSICAL DUE IN 1 YEAR. No follow-ups on file.

## 2022-04-28 ENCOUNTER — Ambulatory Visit (INDEPENDENT_AMBULATORY_CARE_PROVIDER_SITE_OTHER): Payer: 59 | Admitting: Nurse Practitioner

## 2022-04-28 ENCOUNTER — Encounter: Payer: Self-pay | Admitting: Nurse Practitioner

## 2022-04-28 VITALS — BP 108/80 | HR 68 | Temp 97.8°F | Ht 64.0 in | Wt 152.8 lb

## 2022-04-28 DIAGNOSIS — E559 Vitamin D deficiency, unspecified: Secondary | ICD-10-CM

## 2022-04-28 DIAGNOSIS — E7800 Pure hypercholesterolemia, unspecified: Secondary | ICD-10-CM | POA: Diagnosis not present

## 2022-04-28 DIAGNOSIS — Z0000 Encounter for general adult medical examination without abnormal findings: Secondary | ICD-10-CM | POA: Insufficient documentation

## 2022-04-28 DIAGNOSIS — F419 Anxiety disorder, unspecified: Secondary | ICD-10-CM

## 2022-04-28 DIAGNOSIS — F410 Panic disorder [episodic paroxysmal anxiety] without agoraphobia: Secondary | ICD-10-CM | POA: Diagnosis not present

## 2022-04-28 DIAGNOSIS — I471 Supraventricular tachycardia, unspecified: Secondary | ICD-10-CM

## 2022-04-28 DIAGNOSIS — Z1159 Encounter for screening for other viral diseases: Secondary | ICD-10-CM

## 2022-04-28 DIAGNOSIS — R052 Subacute cough: Secondary | ICD-10-CM

## 2022-04-28 LAB — COMPREHENSIVE METABOLIC PANEL
ALT: 10 U/L (ref 0–35)
AST: 17 U/L (ref 0–37)
Albumin: 4.4 g/dL (ref 3.5–5.2)
Alkaline Phosphatase: 50 U/L (ref 39–117)
BUN: 9 mg/dL (ref 6–23)
CO2: 29 mEq/L (ref 19–32)
Calcium: 9.8 mg/dL (ref 8.4–10.5)
Chloride: 103 mEq/L (ref 96–112)
Creatinine, Ser: 0.67 mg/dL (ref 0.40–1.20)
GFR: 105.36 mL/min (ref >60.00)
Glucose, Bld: 90 mg/dL (ref 70–99)
Potassium: 5.1 mEq/L (ref 3.5–5.1)
Sodium: 141 mEq/L (ref 135–145)
Total Bilirubin: 0.5 mg/dL (ref 0.2–1.2)
Total Protein: 7 g/dL (ref 6.0–8.3)

## 2022-04-28 LAB — CBC WITH DIFFERENTIAL/PLATELET
Basophils Absolute: 0.1 10*3/uL (ref 0.0–0.1)
Basophils Relative: 0.9 % (ref 0.0–3.0)
Eosinophils Absolute: 0.2 10*3/uL (ref 0.0–0.7)
Eosinophils Relative: 3.6 % (ref 0.0–5.0)
HCT: 40.5 % (ref 36.0–46.0)
Hemoglobin: 13.4 g/dL (ref 12.0–15.0)
Lymphocytes Relative: 41.1 % (ref 12.0–46.0)
Lymphs Abs: 2.8 10*3/uL (ref 0.7–4.0)
MCHC: 33.2 g/dL (ref 30.0–36.0)
MCV: 96 fl (ref 78.0–100.0)
Monocytes Absolute: 0.5 10*3/uL (ref 0.1–1.0)
Monocytes Relative: 6.8 % (ref 3.0–12.0)
Neutro Abs: 3.2 10*3/uL (ref 1.4–7.7)
Neutrophils Relative %: 47.6 % (ref 43.0–77.0)
Platelets: 313 10*3/uL (ref 150.0–400.0)
RBC: 4.21 Mil/uL (ref 3.87–5.11)
RDW: 13.4 % (ref 11.5–15.5)
WBC: 6.7 10*3/uL (ref 4.0–10.5)

## 2022-04-28 LAB — TSH: TSH: 2.38 u[IU]/mL (ref 0.35–5.50)

## 2022-04-28 LAB — LIPID PANEL
Cholesterol: 176 mg/dL (ref 0–200)
HDL: 65.2 mg/dL (ref >39.00)
LDL Cholesterol: 88 mg/dL (ref 0–99)
NonHDL: 111.15
Total CHOL/HDL Ratio: 3
Triglycerides: 117 mg/dL (ref 0.0–149.0)
VLDL: 23.4 mg/dL (ref 0.0–40.0)

## 2022-04-28 LAB — VITAMIN D 25 HYDROXY (VIT D DEFICIENCY, FRACTURES): VITD: 20.92 ng/mL — ABNORMAL LOW (ref 30.00–100.00)

## 2022-04-28 MED ORDER — SERTRALINE HCL 50 MG PO TABS: 50.0000 mg | ORAL_TABLET | Freq: Every day | ORAL | 3 refills | Status: DC

## 2022-04-28 MED ORDER — VALACYCLOVIR HCL 1 G PO TABS: 1000.0000 mg | ORAL_TABLET | Freq: Three times a day (TID) | ORAL | 2 refills | Status: DC

## 2022-04-28 MED ORDER — QVAR REDIHALER 40 MCG/ACT IN AERB: 2.0000 | INHALATION_SPRAY | Freq: Two times a day (BID) | RESPIRATORY_TRACT | 6 refills | Status: DC

## 2022-04-28 MED ORDER — LORAZEPAM 0.5 MG PO TABS: 0.5000 mg | ORAL_TABLET | Freq: Two times a day (BID) | ORAL | 0 refills | Status: DC | PRN

## 2022-04-28 NOTE — Assessment & Plan Note (Deleted)
Chronic, stable. See plan for anxiety.  ?

## 2022-04-28 NOTE — Assessment & Plan Note (Signed)
Chronic, stable. Symptoms worsened again after running out of qvar inhaler. Will refill this, 2 puffs BID. Follow-up in 6 months.

## 2022-04-28 NOTE — Assessment & Plan Note (Signed)
History of PSVT, denies any palpitations or acute symptoms. Follow-up with any concerns.  ?

## 2022-04-28 NOTE — Assessment & Plan Note (Signed)
Health maintenance reviewed and updated. Discussed nutrition, exercise. Check CMP, CBC, TSH today. Follow-up 1 year.   

## 2022-04-28 NOTE — Patient Instructions (Signed)
It was great to see you!  We are checking your labs today and will let you know the results via mychart/phone.   I have refilled your medications.   Let's follow-up in 6 months, sooner if you have concerns.  If a referral was placed today, you will be contacted for an appointment. Please note that routine referrals can sometimes take up to 3-4 weeks to process. Please call our office if you haven't heard anything after this time frame.  Take care,  Lillar Bianca, NP  

## 2022-04-28 NOTE — Assessment & Plan Note (Signed)
Chronic, stable. See plan for anxiety.  ?

## 2022-04-28 NOTE — Assessment & Plan Note (Signed)
Chronic, stable. She is taking a daily multivitamin with vitamin D. Will check vitamin D today and adjust regimen based on results.

## 2022-04-28 NOTE — Assessment & Plan Note (Signed)
LDL was slightly elevated last year, will repeat lipid panel today and treat based on results.

## 2022-04-28 NOTE — Assessment & Plan Note (Signed)
Chronic, stable. She denies any side effects from sertraline or lorazepam. Refill sent on sertraline 50mg  daily and lorazepam 0.5mg  BID prn panic attack. PDMP reviewed. Follow-up in 1 year or sooner with concerns.

## 2022-04-29 LAB — HEPATITIS C ANTIBODY: Hepatitis C Ab: NONREACTIVE

## 2022-08-09 IMAGING — US US BREAST*L* LIMITED INC AXILLA
1 series · 3 of 3 positions shown · non-contrast
Comparison: None.

CLINICAL DATA: Palpable lump inferior to the left breast.

EXAM:
ULTRASOUND OF THE LEFT BREAST

[Series 1: us breast*left* limited inc axilla · 0.06mm/px · 3 of 3 slices shown]
[im 1/3]
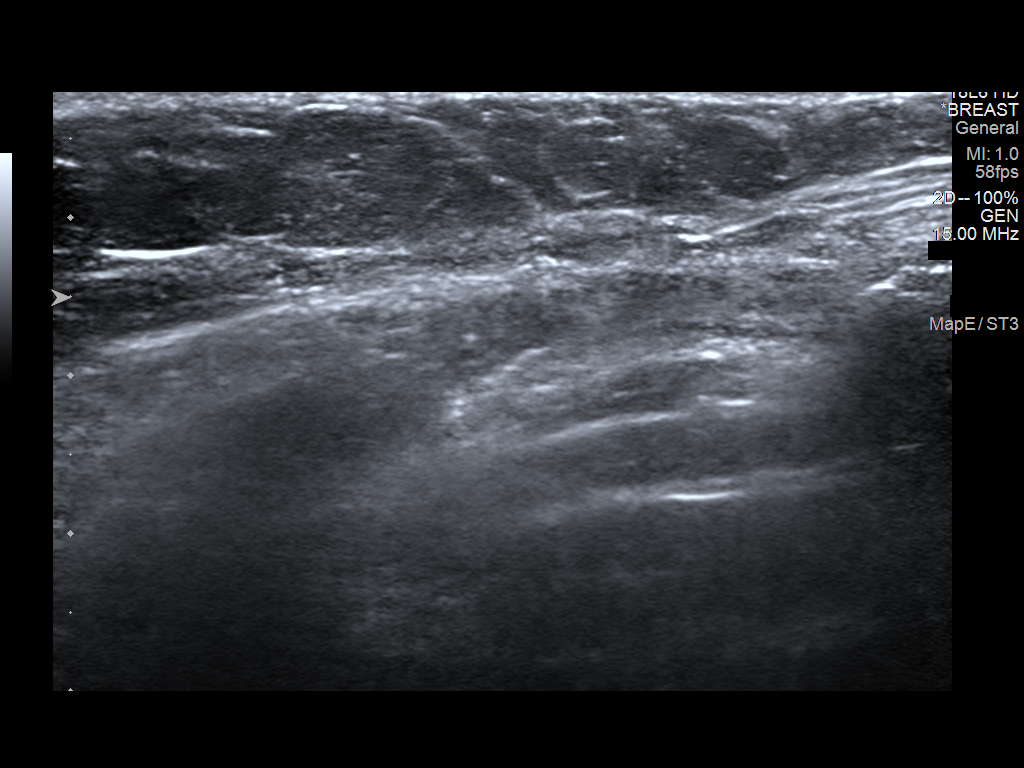
[im 2/3]
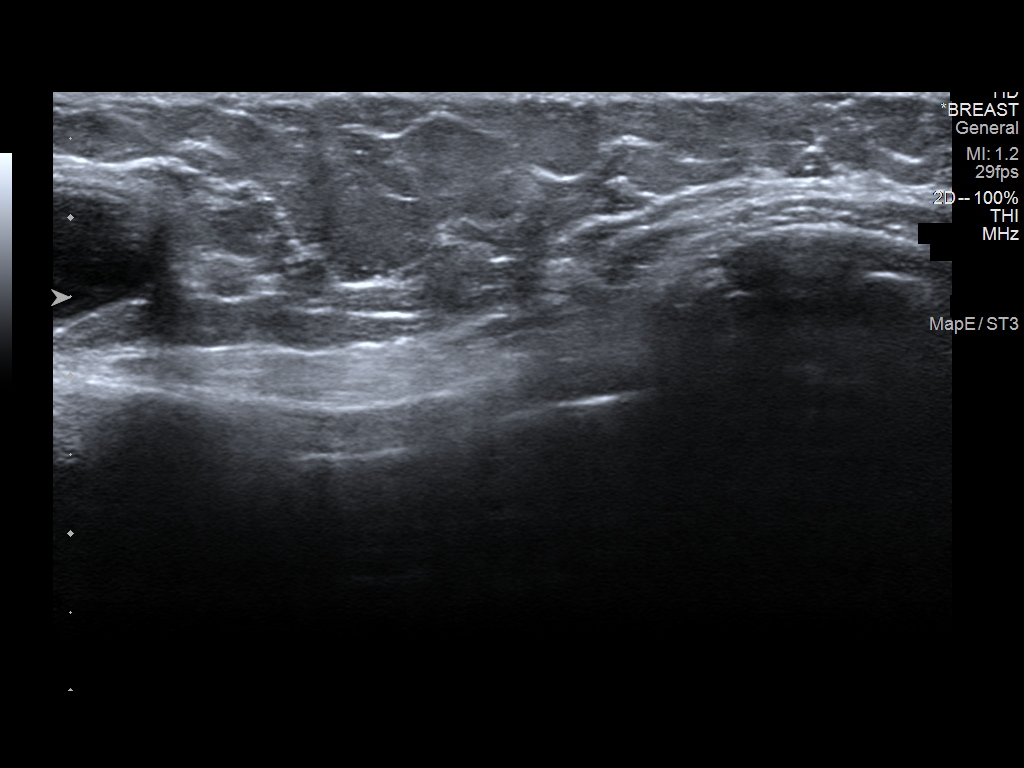
[im 3/3]
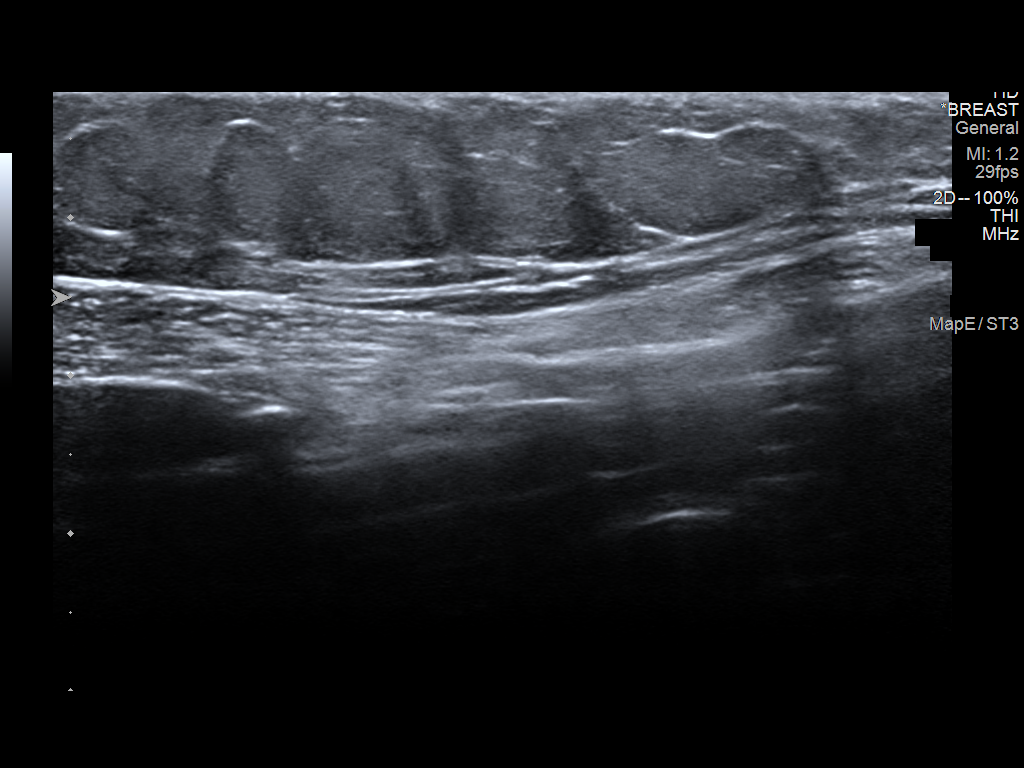

[3 of 3 positions shown; findings below may reference images not displayed]

FINDINGS: Targeted ultrasound is performed, showing no abnormality in the
region of the patient's palpable lump.
IMPRESSION: No sonographic evidence of malignancy. No cause for the patient's
palpable lump is identified.

RECOMMENDATION:
Treatment of the patient's palpable lump should be based on clinical
and physical exam given lack of imaging findings. The patient is due
for bilateral screening mammography in Saturday December, 2020.

I have discussed the findings and recommendations with the patient.
If applicable, a reminder letter will be sent to the patient
regarding the next appointment.

BI-RADS CATEGORY  1: Negative.

## 2022-12-09 ENCOUNTER — Ambulatory Visit: Payer: 59 | Admitting: Nurse Practitioner

## 2022-12-09 ENCOUNTER — Ambulatory Visit (INDEPENDENT_AMBULATORY_CARE_PROVIDER_SITE_OTHER): Payer: 59

## 2022-12-09 VITALS — BP 104/76 | HR 79 | Temp 97.4°F | Ht 64.0 in | Wt 156.6 lb

## 2022-12-09 DIAGNOSIS — K219 Gastro-esophageal reflux disease without esophagitis: Secondary | ICD-10-CM

## 2022-12-09 DIAGNOSIS — J4541 Moderate persistent asthma with (acute) exacerbation: Secondary | ICD-10-CM

## 2022-12-09 MED ORDER — PREDNISONE 20 MG PO TABS: 40.0000 mg | ORAL_TABLET | Freq: Every day | ORAL | 0 refills | Status: DC

## 2022-12-09 MED ORDER — VENTOLIN HFA 108 (90 BASE) MCG/ACT IN AERS: 1.0000 | INHALATION_SPRAY | RESPIRATORY_TRACT | 6 refills | Status: AC | PRN

## 2022-12-09 MED ORDER — OMEPRAZOLE 40 MG PO CPDR: 40.0000 mg | DELAYED_RELEASE_CAPSULE | Freq: Every day | ORAL | 0 refills | Status: DC

## 2022-12-09 NOTE — Patient Instructions (Signed)
It was great to see you!  Start prednisone 2 tablets daily with food  I have ordered a chest xray   Continue using your qvar inhaler twice a day and albuterol every 4 hours  Keep taking benadryl and claritin.   Start omeprazole 1 tablet daily.   Let's follow-up if your symptoms worsen or don't improve  Take care,  Rodman Pickle, NP

## 2022-12-09 NOTE — Progress Notes (Signed)
Acute Office Visit  Subjective:     Patient ID: Amber Case, female    DOB: Feb 15, 1975, 47 y.o.   MRN: [id number]  Chief Complaint  Patient presents with   Cough    With gasping for air at night and some throughout the day    HPI:  Patient is in today for cough.   Discussed the use of AI scribe software for clinical note transcription with the patient, who gave verbal consent to proceed.  History of Present Illness   The patient, with a history of asthma, presents with a persistent cough and nocturnal asthma attacks. She admits to being lax with her inhaler use, which may have contributed to her symptoms. The coughing started a couple of weeks ago, initially attributed to allergies, but has since progressed to consistent nocturnal asthma attacks. The patient wakes up gasping for air and coughs persistently, often requiring cough drops to soothe her throat. The coughing also occurs during the day, but not as frequently. The patient reports wheezing during coughing fits but not during normal breathing. She also reports a burning sensation in her throat, similar to past experiences with acid reflux. The patient has been using an inhaler prescribed for her son and has recently resumed using her own inhalers. She has not been feeling sick, but is tired due to lack of sleep. She has tried pepcid for her acid reflux.       ROS See pertinent positives and negatives per HPI.     Objective:    BP 104/76 (BP Location: Left Arm)   Pulse 79   Temp (!) 97.4 F (36.3 C)   Ht 5\' 4"  (1.626 m)   Wt 156 lb 9.6 oz (71 kg)   LMP 11/22/2022 (Approximate)   SpO2 98%   BMI 26.88 kg/m    Physical Exam Vitals and nursing note reviewed.  Constitutional:      General: She is not in acute distress.    Appearance: Normal appearance.  HENT:     Head: Normocephalic.     Right Ear: Tympanic membrane, ear canal and external ear normal.     Left Ear: Tympanic membrane, ear canal and external ear  normal.     Nose:     Right Sinus: No maxillary sinus tenderness or frontal sinus tenderness.     Left Sinus: No maxillary sinus tenderness or frontal sinus tenderness.     Mouth/Throat:     Mouth: Mucous membranes are moist.     Pharynx: Posterior oropharyngeal erythema present. No oropharyngeal exudate.  Eyes:     Conjunctiva/sclera: Conjunctivae normal.  Cardiovascular:     Rate and Rhythm: Normal rate and regular rhythm.     Pulses: Normal pulses.     Heart sounds: Normal heart sounds.  Pulmonary:     Effort: Pulmonary effort is normal.     Comments: Coughing frequently, diminished lung sound right upper lobe Musculoskeletal:     Cervical back: Normal range of motion and neck supple. No tenderness.  Lymphadenopathy:     Cervical: No cervical adenopathy.  Skin:    General: Skin is warm.  Neurological:     General: No focal deficit present.     Mental Status: She is alert and oriented to person, place, and time.  Psychiatric:        Mood and Affect: Mood normal.        Behavior: Behavior normal.        Thought Content: Thought content normal.  Judgment: Judgment normal.      Assessment & Plan:      Asthma Exacerbation   She is experiencing nightly asthma attacks characterized by coughing fits, waking up gasping for air, and wheezing during coughs. There has been a recent lax use of her Qvar inhaler. No fever, ear pain, or significant congestion is present, and there is no postnasal drip. Diminished lung sounds are noted in the right upper lung. We will start oral prednisone, 40mg  daily with food for 5 days, and continue the Qvar inhaler twice daily. She should use the albuterol inhaler every 4 hours for the next two days. Will also get a chest x-ray with diminished lung sounds in right upper lobe. Continue claritin daily and benadryl as needed. Follow-up if symptoms don't improve or worsen.   Gastroesophageal Reflux Disease (GERD)   She reports a burning sensation in  her throat for the past couple of weeks and has a history of acid reflux during pregnancy, currently managed with Pepcid. We will switch from Pepcid to omeprazole, 40 mg tablet daily. She can take pepcid daily as needed.      Meds ordered this encounter  Medications   omeprazole (PRILOSEC) 40 MG capsule    Sig: Take 1 capsule (40 mg total) by mouth daily.    Dispense:  30 capsule    Refill:  0   predniSONE (DELTASONE) 20 MG tablet    Sig: Take 2 tablets (40 mg total) by mouth daily with breakfast.    Dispense:  10 tablet    Refill:  0   VENTOLIN HFA 108 (90 Base) MCG/ACT inhaler    Sig: Inhale 1-2 puffs into the lungs every 4 (four) hours as needed for wheezing or shortness of breath.    Dispense:  8 g    Refill:  6    Return if symptoms worsen or fail to improve.  Gerre Scull, NP

## 2022-12-31 ENCOUNTER — Other Ambulatory Visit: Payer: Self-pay | Admitting: Nurse Practitioner

## 2022-12-31 NOTE — Telephone Encounter (Signed)
Requesting: OMEPRAZOLE DR 40 MG CAPSULE  Last Visit: 12/09/2022 Next Visit: Visit date not found Last Refill: 12/09/2022  Please Advise   Pharmacy comment: REQUEST FOR 90 DAYS PRESCRIPTION.

## 2023-01-21 ENCOUNTER — Other Ambulatory Visit: Payer: Self-pay | Admitting: Nurse Practitioner

## 2023-01-21 ENCOUNTER — Encounter: Payer: Self-pay | Admitting: Nurse Practitioner

## 2023-01-22 ENCOUNTER — Other Ambulatory Visit: Payer: Self-pay

## 2023-01-22 MED ORDER — VALACYCLOVIR HCL 1 G PO TABS: 1000.0000 mg | ORAL_TABLET | Freq: Three times a day (TID) | ORAL | 2 refills | Status: DC

## 2023-03-01 LAB — HM MAMMOGRAPHY

## 2023-05-04 ENCOUNTER — Ambulatory Visit (INDEPENDENT_AMBULATORY_CARE_PROVIDER_SITE_OTHER): Admitting: Nurse Practitioner

## 2023-05-04 ENCOUNTER — Encounter: Payer: Self-pay | Admitting: Nurse Practitioner

## 2023-05-04 VITALS — BP 108/74 | HR 75 | Temp 96.9°F | Ht 64.0 in | Wt 160.2 lb

## 2023-05-04 DIAGNOSIS — F410 Panic disorder [episodic paroxysmal anxiety] without agoraphobia: Secondary | ICD-10-CM

## 2023-05-04 DIAGNOSIS — E7800 Pure hypercholesterolemia, unspecified: Secondary | ICD-10-CM | POA: Diagnosis not present

## 2023-05-04 DIAGNOSIS — I471 Supraventricular tachycardia, unspecified: Secondary | ICD-10-CM

## 2023-05-04 DIAGNOSIS — Z0000 Encounter for general adult medical examination without abnormal findings: Secondary | ICD-10-CM | POA: Diagnosis not present

## 2023-05-04 DIAGNOSIS — R109 Unspecified abdominal pain: Secondary | ICD-10-CM

## 2023-05-04 DIAGNOSIS — J4540 Moderate persistent asthma, uncomplicated: Secondary | ICD-10-CM | POA: Insufficient documentation

## 2023-05-04 DIAGNOSIS — B009 Herpesviral infection, unspecified: Secondary | ICD-10-CM | POA: Insufficient documentation

## 2023-05-04 DIAGNOSIS — E559 Vitamin D deficiency, unspecified: Secondary | ICD-10-CM

## 2023-05-04 DIAGNOSIS — F419 Anxiety disorder, unspecified: Secondary | ICD-10-CM

## 2023-05-04 LAB — CBC WITH DIFFERENTIAL/PLATELET
Basophils Absolute: 0 10*3/uL (ref 0.0–0.1)
Basophils Relative: 0.8 % (ref 0.0–3.0)
Eosinophils Absolute: 0.3 10*3/uL (ref 0.0–0.7)
Eosinophils Relative: 4.4 % (ref 0.0–5.0)
HCT: 39.1 % (ref 36.0–46.0)
Hemoglobin: 13.2 g/dL (ref 12.0–15.0)
Lymphocytes Relative: 46.1 % — ABNORMAL HIGH (ref 12.0–46.0)
Lymphs Abs: 2.6 10*3/uL (ref 0.7–4.0)
MCHC: 33.8 g/dL (ref 30.0–36.0)
MCV: 99.2 fl (ref 78.0–100.0)
Monocytes Absolute: 0.4 10*3/uL (ref 0.1–1.0)
Monocytes Relative: 7.8 % (ref 3.0–12.0)
Neutro Abs: 2.3 10*3/uL (ref 1.4–7.7)
Neutrophils Relative %: 40.9 % — ABNORMAL LOW (ref 43.0–77.0)
Platelets: 280 10*3/uL (ref 150.0–400.0)
RBC: 3.94 Mil/uL (ref 3.87–5.11)
RDW: 14.2 % (ref 11.5–15.5)
WBC: 5.7 10*3/uL (ref 4.0–10.5)

## 2023-05-04 LAB — POCT URINALYSIS DIPSTICK
Bilirubin, UA: NEGATIVE
Blood, UA: NEGATIVE
Glucose, UA: NEGATIVE
Ketones, UA: NEGATIVE
Leukocytes, UA: NEGATIVE
Nitrite, UA: NEGATIVE
Protein, UA: NEGATIVE
Spec Grav, UA: <=1.005 — AB (ref 1.010–1.025)
Urobilinogen, UA: 0.2 U/dL
pH, UA: 7 (ref 5.0–8.0)

## 2023-05-04 LAB — LIPID PANEL
Cholesterol: 188 mg/dL (ref 0–200)
HDL: 57.8 mg/dL (ref >39.00)
LDL Cholesterol: 108 mg/dL — ABNORMAL HIGH (ref 0–99)
NonHDL: 130.01
Total CHOL/HDL Ratio: 3
Triglycerides: 108 mg/dL (ref 0.0–149.0)
VLDL: 21.6 mg/dL (ref 0.0–40.0)

## 2023-05-04 LAB — COMPREHENSIVE METABOLIC PANEL WITH GFR
ALT: 14 U/L (ref 0–35)
AST: 21 U/L (ref 0–37)
Albumin: 4.6 g/dL (ref 3.5–5.2)
Alkaline Phosphatase: 46 U/L (ref 39–117)
BUN: 14 mg/dL (ref 6–23)
CO2: 28 meq/L (ref 19–32)
Calcium: 9.6 mg/dL (ref 8.4–10.5)
Chloride: 100 meq/L (ref 96–112)
Creatinine, Ser: 0.61 mg/dL (ref 0.40–1.20)
GFR: 107.01 mL/min (ref >60.00)
Glucose, Bld: 83 mg/dL (ref 70–99)
Potassium: 3.9 meq/L (ref 3.5–5.1)
Sodium: 137 meq/L (ref 135–145)
Total Bilirubin: 0.4 mg/dL (ref 0.2–1.2)
Total Protein: 7.5 g/dL (ref 6.0–8.3)

## 2023-05-04 LAB — VITAMIN D 25 HYDROXY (VIT D DEFICIENCY, FRACTURES): VITD: 25.95 ng/mL — ABNORMAL LOW (ref 30.00–100.00)

## 2023-05-04 LAB — TSH: TSH: 2.89 u[IU]/mL (ref 0.35–5.50)

## 2023-05-04 MED ORDER — LORAZEPAM 0.5 MG PO TABS: 0.5000 mg | ORAL_TABLET | Freq: Two times a day (BID) | ORAL | 0 refills | Status: AC | PRN

## 2023-05-04 MED ORDER — VALACYCLOVIR HCL 500 MG PO TABS
500.0000 mg | ORAL_TABLET | Freq: Every day | ORAL | 0 refills | Status: AC
Start: 2023-05-04 — End: ?

## 2023-05-04 MED ORDER — VALACYCLOVIR HCL 1 G PO TABS: 1000.0000 mg | ORAL_TABLET | Freq: Three times a day (TID) | ORAL | 2 refills | Status: AC

## 2023-05-04 MED ORDER — SERTRALINE HCL 50 MG PO TABS: 50.0000 mg | ORAL_TABLET | Freq: Every day | ORAL | 3 refills | Status: AC

## 2023-05-04 NOTE — Assessment & Plan Note (Signed)
History of PSVT, denies any palpitations or acute symptoms. Follow-up with any concerns.  ?

## 2023-05-04 NOTE — Assessment & Plan Note (Deleted)
Chronic, stable. See plan for anxiety.  ?

## 2023-05-04 NOTE — Patient Instructions (Signed)
 It was great to see you!  We are checking your labs today and will let you know the results via mychart/phone.   I have refilled your medications  Start valtrex 500mg  once a day to prevent cold sores, then if you do get one, take the other prescription   Let's follow-up in 1 year, sooner if you have concerns.  If a referral was placed today, you will be contacted for an appointment. Please note that routine referrals can sometimes take up to 3-4 weeks to process. Please call our office if you haven't heard anything after this time frame.  Take care,  Rodman Pickle, NP

## 2023-05-04 NOTE — Assessment & Plan Note (Signed)
Health maintenance reviewed and updated. Discussed nutrition, exercise. Follow-up 1 year.

## 2023-05-04 NOTE — Progress Notes (Signed)
 BP 108/74 (BP Location: Left Arm, Patient Position: Sitting, Cuff Size: Normal)   Pulse 75   Temp (!) 96.9 F (36.1 C)   Ht 5\' 4"  (1.626 m)   Wt 160 lb 3.2 oz (72.7 kg)   LMP 04/24/2023 (Approximate)   SpO2 96%   BMI 27.50 kg/m    Subjective:    Patient ID: Amber Case, female    DOB: 18-Feb-1975, 48 y.o.   MRN: [id number]  CC: Chief Complaint  Patient presents with   Annual Exam    With fasting lab work, Rx Refills, request urine check due to recent back pain    HPI: Amber Case is a 48 y.o. female presenting on 05/04/2023 for comprehensive medical examination. Current medical complaints include: back pain  She has been experiencing low back pain for the last week. She states that it is faint. She denies dysuria, urinary frequency, and fever. She states the last time she felt this, she had a UTI.   She currently lives with: husband, children Menopausal Symptoms: yes - hot flashes  Depression and Anxiety Screen done today and results listed below:     05/04/2023    9:36 AM 12/09/2022    9:14 AM 04/28/2022   10:14 AM 04/22/2021    9:47 AM 08/01/2020   11:03 AM  Depression screen PHQ 2/9  Decreased Interest 0 0 0 0 0  Down, Depressed, Hopeless 0 0 1 0 0  PHQ - 2 Score 0 0 1 0 0  Altered sleeping 1 3 1 1    Tired, decreased energy 1 0 2 1   Change in appetite 0 0 1 1   Feeling bad or failure about yourself  0 0 0 0   Trouble concentrating 1 3 1 1    Moving slowly or fidgety/restless 0 0 0 0   Suicidal thoughts 0 0 0 0   PHQ-9 Score 3 6 6 4    Difficult doing work/chores Somewhat difficult Somewhat difficult Somewhat difficult Somewhat difficult       05/04/2023    9:36 AM 12/09/2022    9:14 AM 04/28/2022   10:15 AM 04/22/2021    9:47 AM  GAD 7 : Generalized Anxiety Score  Nervous, Anxious, on Edge 1 0 1 1  Control/stop worrying 0 0 0 1  Worry too much - different things 1 1 1 1   Trouble relaxing 1 1 0 0  Restless 0 1 0 0  Easily annoyed or irritable 1 1 1 1   Afraid  - awful might happen 0 0 0 0  Total GAD 7 Score 4 4 3 4   Anxiety Difficulty Somewhat difficult Not difficult at all Not difficult at all Somewhat difficult    The patient does not have a history of falls. I did not complete a risk assessment for falls. A plan of care for falls was not documented.   Past Medical History:  Past Medical History:  Diagnosis Date   Abdominal hernia    Anxiety    Gestational diabetes    First pregnancy   H. pylori infection    History of abnormal cervical Pap smear    Irritable bowel syndrome    Palpitations    SVT (supraventricular tachycardia) (HCC)    Tubular adenoma of colon     Surgical History:  Past Surgical History:  Procedure Laterality Date   CESAREAN SECTION     DOPPLER ECHOCARDIOGRAPHY  05/24/2009   trace pulmonary regurgitation.  trace tricuspid regurgitation. LV normal.  Trace mitral regurgitation.     heart monitor     NO RESULT READ   nuclear testing  08/30/2009   NM MYOCAR MULTI w/SPECT; Mild septal hypokinesis.  EF 63%   PLACEMENT OF BREAST IMPLANTS Bilateral     Medications:  Current Outpatient Medications on File Prior to Visit  Medication Sig   beclomethasone (QVAR REDIHALER) 40 MCG/ACT inhaler Inhale 2 puffs into the lungs 2 (two) times daily.   cholecalciferol (VITAMIN D3) 25 MCG (1000 UNIT) tablet Take 2,000 Units by mouth daily.   Loratadine (CLARITIN PO) Take by mouth.   Multiple Vitamin (MULTIVITAMIN) tablet Take 1 tablet by mouth daily.   omeprazole (PRILOSEC) 40 MG capsule TAKE 1 CAPSULE (40 MG TOTAL) BY MOUTH DAILY.   VENTOLIN HFA 108 (90 Base) MCG/ACT inhaler Inhale 1-2 puffs into the lungs every 4 (four) hours as needed for wheezing or shortness of breath.   No current facility-administered medications on file prior to visit.    Allergies:  Allergies  Allergen Reactions   Sulfa Antibiotics Hives   Ofloxacin Itching    Hives; Sweating   Sulfonamide Derivatives     Social History:  Social History    Socioeconomic History   Marital status: Married    Spouse name: Not on file   Number of children: 3   Years of education: Not on file   Highest education level: Bachelor's degree (e.g., BA, AB, BS)  Occupational History   Occupation: homemaker  Tobacco Use   Smoking status: Never   Smokeless tobacco: Never  Vaping Use   Vaping status: Never Used  Substance and Sexual Activity   Alcohol use: No   Drug use: No   Sexual activity: Yes    Partners: Male    Birth control/protection: Condom  Other Topics Concern   Not on file  Social History Narrative   Her daughter has a chromosome 15 defect   Social Drivers of Corporate investment banker Strain: Low Risk  (12/09/2022)   Overall Financial Resource Strain (CARDIA)    Difficulty of Paying Living Expenses: Not hard at all  Food Insecurity: No Food Insecurity (12/09/2022)   Hunger Vital Sign    Worried About Running Out of Food in the Last Year: Never true    Ran Out of Food in the Last Year: Never true  Transportation Needs: No Transportation Needs (12/09/2022)   PRAPARE - Administrator, Civil Service (Medical): No    Lack of Transportation (Non-Medical): No  Physical Activity: Insufficiently Active (12/09/2022)   Exercise Vital Sign    Days of Exercise per Week: 2 days    Minutes of Exercise per Session: 50 min  Stress: No Stress Concern Present (12/09/2022)   [employer] of Occupational Health - Occupational Stress Questionnaire    Feeling of Stress : Only a little  Social Connections: Socially Integrated (12/09/2022)   Social Connection and Isolation Panel [NHANES]    Frequency of Communication with Friends and Family: More than three times a week    Frequency of Social Gatherings with Friends and Family: Once a week    Attends Religious Services: 1 to 4 times per year    Active Member of [employer] or Organizations: Yes    Attends Banker Meetings: 1 to 4 times per year    Marital Status:  Married  Catering manager Violence: Not on file   Social History   Tobacco Use  Smoking Status Never  Smokeless Tobacco Never  Social History   Substance and Sexual Activity  Alcohol Use No    Family History:  Family History  Problem Relation Age of Onset   Heart murmur Mother    Colitis Mother    Irritable bowel syndrome Mother    High blood pressure Brother    Irritable bowel syndrome Brother    Heart attack Maternal Grandmother    Heart disease Maternal Grandmother    Prostate cancer Paternal Grandfather    Diabetes Maternal Aunt    Heart disease Maternal Aunt    Diabetes Maternal Uncle    Diabetes Paternal Aunt    Diabetes Paternal Uncle    Stomach cancer Maternal Grandfather     Past medical history, surgical history, medications, allergies, family history and social history reviewed with patient today and changes made to appropriate areas of the chart.   Review of Systems  Constitutional: Negative.   HENT: Negative.    Eyes: Negative.   Respiratory:  Positive for shortness of breath (at night since pollen started).   Cardiovascular: Negative.   Gastrointestinal: Negative.   Genitourinary:  Positive for flank pain. Negative for dysuria, frequency and urgency.  Skin: Negative.   Neurological: Negative.   Psychiatric/Behavioral: Negative.     All other ROS negative except what is listed above and in the HPI.      Objective:    BP 108/74 (BP Location: Left Arm, Patient Position: Sitting, Cuff Size: Normal)   Pulse 75   Temp (!) 96.9 F (36.1 C)   Ht 5\' 4"  (1.626 m)   Wt 160 lb 3.2 oz (72.7 kg)   LMP 04/24/2023 (Approximate)   SpO2 96%   BMI 27.50 kg/m   Wt Readings from Last 3 Encounters:  05/04/23 160 lb 3.2 oz (72.7 kg)  12/09/22 156 lb 9.6 oz (71 kg)  04/28/22 152 lb 12.8 oz (69.3 kg)    Physical Exam Vitals and nursing note reviewed.  Constitutional:      General: She is not in acute distress.    Appearance: Normal appearance.  HENT:      Head: Normocephalic and atraumatic.     Right Ear: Tympanic membrane, ear canal and external ear normal.     Left Ear: Tympanic membrane, ear canal and external ear normal.     Mouth/Throat:     Mouth: Mucous membranes are moist.     Pharynx: No posterior oropharyngeal erythema.  Eyes:     Conjunctiva/sclera: Conjunctivae normal.  Cardiovascular:     Rate and Rhythm: Normal rate and regular rhythm.     Pulses: Normal pulses.     Heart sounds: Normal heart sounds.  Pulmonary:     Effort: Pulmonary effort is normal.     Breath sounds: Normal breath sounds.  Abdominal:     Palpations: Abdomen is soft.     Tenderness: There is no abdominal tenderness. There is no right CVA tenderness or left CVA tenderness.  Musculoskeletal:        General: Normal range of motion.     Cervical back: Normal range of motion and neck supple.     Right lower leg: No edema.     Left lower leg: No edema.  Lymphadenopathy:     Cervical: No cervical adenopathy.  Skin:    General: Skin is warm and dry.  Neurological:     General: No focal deficit present.     Mental Status: She is alert and oriented to person, place, and time.     Cranial  Nerves: No cranial nerve deficit.     Coordination: Coordination normal.     Gait: Gait normal.  Psychiatric:        Mood and Affect: Mood normal.        Behavior: Behavior normal.        Thought Content: Thought content normal.        Judgment: Judgment normal.     Results for orders placed or performed in visit on 05/04/23  HM MAMMOGRAPHY   Collection Time: 03/01/23 12:00 AM  Result Value Ref Range   HM Mammogram 0-4 Bi-Rad 0-4 Bi-Rad, Self Reported Normal  POCT urinalysis dipstick   Collection Time: 05/04/23  9:42 AM  Result Value Ref Range   Color, UA YELLOW    Clarity, UA     Glucose, UA Negative Negative   Bilirubin, UA Negative    Ketones, UA Negative    Spec Grav, UA <=1.005 (A) 1.010 - 1.025   Blood, UA Negative    pH, UA 7.0 5.0 - 8.0    Protein, UA Negative Negative   Urobilinogen, UA 0.2 0.2 or 1.0 E.U./dL   Nitrite, UA Negative    Leukocytes, UA Negative Negative   Appearance     Odor        Assessment & Plan:   Problem List Items Addressed This Visit       Cardiovascular and Mediastinum   PSVT (paroxysmal supraventricular tachycardia) (HCC)   History of PSVT, denies any palpitations or acute symptoms. Follow-up with any concerns.       Relevant Orders   CBC with Differential/Platelet   Comprehensive metabolic panel with GFR   TSH     Respiratory   Moderate persistent asthma without complication   Chronic, ongoing. She is taking qvar inhaler BID and ventolin prn shortness of breath. Her symptoms have slightly worsened with the pollen. She is taking claritin daily. She took nyquil the past 2 nights which helped. She can continue this. Follow-up if symptoms worsen or don't improve. Declined prevnar today since she is going on vacation.         Other   Anxiety   Chronic, stable. She denies any side effects from sertraline or lorazepam. Refill sent on sertraline 50mg  daily and lorazepam 0.5mg  BID prn panic attack. PDMP reviewed. Follow-up in 1 year or sooner with concerns.       Relevant Medications   sertraline (ZOLOFT) 50 MG tablet   LORazepam (ATIVAN) 0.5 MG tablet   Vitamin D insufficiency   Chronic, stable. She is taking a daily vitamin D supplement 2,000 units daily. Will check vitamin D today and adjust regimen based on results.       Relevant Orders   VITAMIN D 25 Hydroxy (Vit-D Deficiency, Fractures)   Pure hypercholesterolemia   Chronic, stable. Check CMP, CBC, lipid panel and treat based on results.       Relevant Orders   CBC with Differential/Platelet   Comprehensive metabolic panel with GFR   Lipid panel   Routine general medical examination at a health care facility - Primary   Health maintenance reviewed and updated. Discussed nutrition, exercise. Follow-up 1 year.        HSV-1  infection   She has been experiencing flare-ups almost monthly. Will start valtrex 500mg  daily for suppressive therapy then regular dose for acute flare-ups.       Relevant Medications   valACYclovir (VALTREX) 1000 MG tablet   valACYclovir (VALTREX) 500 MG tablet   Other Visit Diagnoses  Flank pain       U/A negative. Can take ibuprofen/tylenol prn pain. F/U if not improving   Relevant Orders   POCT urinalysis dipstick (Completed)     Panic disorder       Relevant Medications   sertraline (ZOLOFT) 50 MG tablet   LORazepam (ATIVAN) 0.5 MG tablet        Follow up plan: Return in about 1 year (around 05/03/2024) for CPE.   LABORATORY TESTING:  - Pap smear: up to date  IMMUNIZATIONS:   - Tdap: Tetanus vaccination status reviewed: last tetanus booster within 10 years. - Influenza: Postponed to flu season - Pneumovax: Not applicable - Prevnar: Declined - HPV: Not applicable - Shingrix vaccine: Not applicable  SCREENING: -Mammogram: Up to date  - Colonoscopy: Up to date  - Bone Density: Not applicable   PATIENT COUNSELING:   Advised to take 1 mg of folate supplement per day if capable of pregnancy.   Sexuality: Discussed sexually transmitted diseases, partner selection, use of condoms, avoidance of unintended pregnancy  and contraceptive alternatives.   Advised to avoid cigarette smoking.  I discussed with the patient that most people either abstain from alcohol or drink within safe limits (<=14/week and <=4 drinks/occasion for males, <=7/weeks and <= 3 drinks/occasion for females) and that the risk for alcohol disorders and other health effects rises proportionally with the number of drinks per week and how often a drinker exceeds daily limits.  Discussed cessation/primary prevention of drug use and availability of treatment for abuse.   Diet: Encouraged to adjust caloric intake to maintain  or achieve ideal body weight, to reduce intake of dietary saturated fat and  total fat, to limit sodium intake by avoiding high sodium foods and not adding table salt, and to maintain adequate dietary potassium and calcium preferably from fresh fruits, vegetables, and low-fat dairy products.    stressed the importance of regular exercise  Injury prevention: Discussed safety belts, safety helmets, smoke detector, smoking near bedding or upholstery.   Dental health: Discussed importance of regular tooth brushing, flossing, and dental visits.    NEXT PREVENTATIVE PHYSICAL DUE IN 1 YEAR. Return in about 1 year (around 05/03/2024) for CPE.  Franziska Podgurski A Guadalupe Kerekes

## 2023-05-04 NOTE — Assessment & Plan Note (Addendum)
 Chronic, stable. She is taking a daily vitamin D supplement 2,000 units daily. Will check vitamin D today and adjust regimen based on results.

## 2023-05-04 NOTE — Assessment & Plan Note (Signed)
Chronic, stable. Check CMP, CBC, lipid panel and treat based on results.

## 2023-05-04 NOTE — Assessment & Plan Note (Signed)
 Chronic, ongoing. She is taking qvar inhaler BID and ventolin prn shortness of breath. Her symptoms have slightly worsened with the pollen. She is taking claritin daily. She took nyquil the past 2 nights which helped. She can continue this. Follow-up if symptoms worsen or don't improve. Declined prevnar today since she is going on vacation.

## 2023-05-04 NOTE — Assessment & Plan Note (Signed)
Chronic, stable. She denies any side effects from sertraline or lorazepam. Refill sent on sertraline 50mg  daily and lorazepam 0.5mg  BID prn panic attack. PDMP reviewed. Follow-up in 1 year or sooner with concerns.

## 2023-05-04 NOTE — Assessment & Plan Note (Signed)
 She has been experiencing flare-ups almost monthly. Will start valtrex 500mg  daily for suppressive therapy then regular dose for acute flare-ups.

## 2023-05-05 ENCOUNTER — Encounter: Payer: Self-pay | Admitting: Nurse Practitioner

## 2023-07-10 ENCOUNTER — Other Ambulatory Visit: Payer: Self-pay | Admitting: Nurse Practitioner

## 2023-07-12 NOTE — Telephone Encounter (Signed)
 Requesting: OMEPRAZOLE  DR 40 MG CAPSULE  Last Visit: 05/04/2023 Next Visit: Visit date not found Last Refill: 12/31/2022  Please Advise

## 2023-07-23 ENCOUNTER — Other Ambulatory Visit: Payer: Self-pay | Admitting: Nurse Practitioner

## 2023-07-23 NOTE — Telephone Encounter (Signed)
 Requesting: QVAR  REDIHALER 40 MCG  Last Visit: 05/04/2023 Next Visit: Visit date not found Last Refill: 05/20/2022  Please Advise

## 2023-07-28 ENCOUNTER — Encounter: Payer: Self-pay | Admitting: Internal Medicine

## 2023-07-28 ENCOUNTER — Ambulatory Visit (INDEPENDENT_AMBULATORY_CARE_PROVIDER_SITE_OTHER): Admitting: Internal Medicine

## 2023-07-28 VITALS — BP 100/80 | HR 74 | Temp 98.0°F | Ht 64.0 in | Wt 157.4 lb

## 2023-07-28 DIAGNOSIS — S91209A Unspecified open wound of unspecified toe(s) with damage to nail, initial encounter: Secondary | ICD-10-CM

## 2023-07-28 NOTE — Progress Notes (Signed)
 [facility] [address] [place] [address] Dept: [phone] Dept Fax: [phone]  Acute Care Office Visit  Subjective:   Amber Case 1975-08-01 07/28/2023  Chief Complaint  Patient presents with   Toe Injury    Happened last Tuesday     HPI: Amber Case is a 48 year old female who presents complaining of left great toenail injury onset 1 week ago.  She states she was lifting a piano when the piano leg hit the front of her toe and caused the nail to lift away from the nailbed.  She states the piano leg did not fall down on her toe . No crush injury. One of her family members was able to turn the nail back down onto the nailbed since a portion of the nail was still intact.  Patient has been doing salt water soaks, antiseptic spray, and Neosporin to prevent infection.    The following portions of the patient's history were reviewed and updated as appropriate: past medical history, past surgical history, family history, social history, allergies, medications, and problem list.   Patient Active Problem List   Diagnosis Date Noted   Moderate persistent asthma without complication 05/04/2023   HSV-1 infection 05/04/2023   Pure hypercholesterolemia 04/28/2022   Routine general medical examination at a health care facility 04/28/2022   Subacute cough 10/29/2021   Chronic midline low back pain without sciatica 09/12/2021   Rash 07/04/2021   COVID-19 04/09/2021   Mass of chest wall, left 08/01/2020   Vitamin D  insufficiency 04/10/2020   Anxiety 12/02/2017   Fatty infiltration of liver 12/24/2016   Abnormal stress echo 12/03/2016   PSVT (paroxysmal supraventricular tachycardia) (HCC) 10/29/2016   Irritable bowel syndrome with constipation 01/07/2016   Mass of breast at 6 o'clock position 09/24/2014   Past Medical History:  Diagnosis Date   Abdominal hernia    Anxiety    Gestational diabetes    First pregnancy    H. pylori infection    History of abnormal cervical Pap smear    Irritable bowel syndrome    Palpitations    SVT (supraventricular tachycardia) (HCC)    Tubular adenoma of colon    Past Surgical History:  Procedure Laterality Date   CESAREAN SECTION     DOPPLER ECHOCARDIOGRAPHY  05/24/2009   trace pulmonary regurgitation.  trace tricuspid regurgitation. LV normal.  Trace mitral regurgitation.     heart monitor     NO RESULT READ   nuclear testing  08/30/2009   NM MYOCAR MULTI w/SPECT; Mild septal hypokinesis.  EF 63%   PLACEMENT OF BREAST IMPLANTS Bilateral    Family History  Problem Relation Age of Onset   Heart murmur Mother    Colitis Mother    Irritable bowel syndrome Mother    High blood pressure Brother    Irritable bowel syndrome Brother    Heart attack Maternal Grandmother    Heart disease Maternal Grandmother    Prostate cancer Paternal Grandfather    Diabetes Maternal Aunt    Heart disease Maternal Aunt    Diabetes Maternal Uncle    Diabetes Paternal Aunt    Diabetes Paternal Uncle    Stomach cancer Maternal Grandfather     Current Outpatient Medications:    cholecalciferol (VITAMIN D3) 25 MCG (1000 UNIT) tablet, Take 2,000 Units by mouth daily., Disp: , Rfl:    Loratadine (CLARITIN PO), Take by mouth., Disp: , Rfl:    LORazepam  (ATIVAN ) 0.5 MG tablet, Take  1 tablet (0.5 mg total) by mouth 2 (two) times daily as needed (severe anxiety/panic)., Disp: 30 tablet, Rfl: 0   Multiple Vitamin (MULTIVITAMIN) tablet, Take 1 tablet by mouth daily., Disp: , Rfl:    omeprazole  (PRILOSEC) 40 MG capsule, TAKE 1 CAPSULE (40 MG TOTAL) BY MOUTH DAILY., Disp: 90 capsule, Rfl: 3   QVAR  REDIHALER 40 MCG/ACT inhaler, INHALE 2 PUFFS INTO THE LUNGS TWICE A DAY, Disp: 10.6 g, Rfl: 7   sertraline  (ZOLOFT ) 50 MG tablet, Take 1 tablet (50 mg total) by mouth daily., Disp: 90 tablet, Rfl: 3   valACYclovir  (VALTREX ) 1000 MG tablet, Take 1 tablet (1,000 mg total) by mouth 3 (three) times  daily. For 10 days as needed for outbreak, Disp: 30 tablet, Rfl: 2   valACYclovir  (VALTREX ) 500 MG tablet, Take 1 tablet (500 mg total) by mouth daily., Disp: 90 tablet, Rfl: 0   VENTOLIN  HFA 108 (90 Base) MCG/ACT inhaler, Inhale 1-2 puffs into the lungs every 4 (four) hours as needed for wheezing or shortness of breath., Disp: 8 g, Rfl: 6 Allergies  Allergen Reactions   Sulfa Antibiotics Hives   Ofloxacin Itching    Hives; Sweating   Sulfonamide Derivatives      ROS: A complete ROS was performed with pertinent positives/negatives noted in the HPI. The remainder of the ROS are negative.    Objective:   Today's Vitals   07/28/23 1516  BP: 100/80  Pulse: 74  Temp: 98 F (36.7 C)  TempSrc: Temporal  SpO2: 97%  Weight: 157 lb 6.4 oz (71.4 kg)  Height: 5' 4 (1.626 m)    GENERAL: Well-appearing, in NAD. Well nourished.  SKIN: Pink, warm and dry.  RESPIRATORY: Respirations even and non-labored.  CARDIAC: Peripheral pulses 2+ bilaterally.  MSK: 2+ DP pulses. Full range of motion to left great toenail.  Partially avulsed toenail attached to the proximal lateral nail margin.  With subungual dried blood present. EXTREMITIES: Without clubbing, cyanosis, or edema.  NEUROLOGIC:  Steady, even gait.  PSYCH/MENTAL STATUS: Alert, oriented x 3. Cooperative, appropriate mood and affect.    No results found for any visits on 07/28/23.    Assessment & Plan:  1. Avulsion of toenail, initial encounter (Primary) -Neosporin placed on toenail, covered with sterile gauze 2x2's  and wrapped gently with coban wrap - Advised patient to continue Neosporin and keep area covered.  Discussed the possibility that podiatry will need to completely remove the toenail due to the severity of the injury.  Patient is aware and will follow-up with podiatry. - Ambulatory referral to Podiatry  No orders of the defined types were placed in this encounter.  Orders Placed This Encounter  Procedures   Ambulatory  referral to Podiatry    Referral Priority:   Urgent    Referral Type:   Consultation    Referral Reason:   Specialty Services Required    Requested Specialty:   Podiatry    Number of Visits Requested:   1   Lab Orders  No laboratory test(s) ordered today   No images are attached to the encounter or orders placed in the encounter.  Return if symptoms worsen or fail to improve.   Rosina Senters, FNP

## 2023-08-02 ENCOUNTER — Ambulatory Visit (INDEPENDENT_AMBULATORY_CARE_PROVIDER_SITE_OTHER): Admitting: Podiatry

## 2023-08-02 DIAGNOSIS — S90212A Contusion of left great toe with damage to nail, initial encounter: Secondary | ICD-10-CM | POA: Diagnosis not present

## 2023-08-02 NOTE — Patient Instructions (Signed)

## 2023-08-02 NOTE — Progress Notes (Signed)
        Subjective:  Patient ID: Amber Case, female    DOB: Mar 26, 1975,  MRN: [id number]  Amber Case presents to clinic today for:  Chief Complaint  Patient presents with   Toe Injury    Left 1st. 2 weeks ago. Seen by pcp office and was referred here to get opinion on whether it needed treatment. Has been doing salt water soaks, using wound wash/antiseptic spray. Keeping covered with coban and gauze. No pain, no visual redness, no drainage. Some blood under the nail. Not diabetic, no anticoag.    Patient dropped the leg of a piece of furniture on the top of the left toe which caused immediate damage to the left great toenail.  States that it was turning sideways and lifting off.  States that family member did stick it back on and it has stuck onto the nailbed ever since.  She did have bloody drainage but this has stopped.  She has had pain to the area.  She was referred here by her PCP  Allergies  Allergen Reactions   Sulfa Antibiotics Hives   Ofloxacin Itching    Hives; Sweating   Sulfonamide Derivatives     Objective:  Amber Case is a pleasant 48 y.o. female in NAD. AAO x 3.  Vascular Examination: Capillary refill time is 3-5 seconds to toes bilateral. Palpable pedal pulses b/l LE. Digital hair present b/l. No pedal edema b/l. Skin temperature gradient WNL b/l. No varicosities b/l. No cyanosis or clubbing noted b/l.   Dermatological Examination: There is subungual dried blood present of the left hallux nail.  There is distal onycholysis to the distal 70 to 80% of the toenail.  It is still attached at the proximal lateral nail margin.  No purulence is noted.  No active drainage is seen.  There is pain with compression of the nail plate.  Neurological Examination: Epicritic sensation is intact to the toes.  Assessment/Plan: 1. Contusion of left great toe with damage to nail, initial encounter     Discussed patient's condition today.  After obtaining patient consent,  the left hallux was anesthetized with a 50:50 mixture of 1% lidocaine plain and 0.5% bupivacaine plain for a total of 3cc's administered.  Upon confirmation of anesthesia, a freer elevator was utilized to free the left hallux toenail from the nail bed.  The nail was then avulsed proximal to the eponychium and removed in toto.  The area was inspected for any remaining spicules.   Antibiotic ointment and a DSD were applied, followed by a Coban dressing.  Patient tolerated the anesthetic and procedure well and will f/u in 2-3 weeks for recheck.  Patient given post-procedure instructions for daily 15-minute Epsom salt soaks, antibiotic ointment and daily use of Bandaids until toe starts to dry / form eschar.   Return in about 2 weeks (around 08/16/2023) for nail avulsion recheck.   Amber Case, DPM, FACFAS [facility]     [address] [place], [place] [address]                Office [phone]  Fax [phone]

## 2023-08-09 ENCOUNTER — Ambulatory Visit (INDEPENDENT_AMBULATORY_CARE_PROVIDER_SITE_OTHER): Admitting: Podiatry

## 2023-08-09 ENCOUNTER — Ambulatory Visit: Admitting: Nurse Practitioner

## 2023-08-09 DIAGNOSIS — S90212D Contusion of left great toe with damage to nail, subsequent encounter: Secondary | ICD-10-CM

## 2023-08-09 NOTE — Progress Notes (Unsigned)
       Subjective:  Patient ID: Amber Case, female    DOB: [birth year],  MRN: [id number]  Chief Complaint  Patient presents with   Nail Problem    2 week follow up from left hallux nail avulsion  Pt stated that she is doing well    Amber Case presents to clinic today for f/u of avulsion to the left hallux toenail secondary to injury.  She denies any drainage.  She states it occasionally has some increased sensitivity.  She had been covering the toe up until yesterday.  PCP is Case, Amber A, NP.  Allergies  Allergen Reactions   Sulfa Antibiotics Hives   Ofloxacin Itching    Hives; Sweating   Sulfonamide Derivatives     Objective:  There were no vitals filed for this visit.  Vascular Examination: Capillary refill time is 3-5 seconds to toes bilateral. Palpable pedal pulses b/l LE. Digital hair present b/l. No pedal edema b/l. Skin temperature gradient WNL b/l. No varicosities b/l. No cyanosis or clubbing noted b/l.   Dermatological Examination: Upon inspection of the nail avulsion site, there are no clinical signs of infection.  No purulence, no necrosis, no malodor present.  Minimal to no erythema present.  Eschar formed along nail margin.  Minimal to no pain on palpation of area.   Assessment/Plan: 1. Contusion of left great toe with damage to nail, subsequent encounter     Patient informed that there is no signs of infection and the area is healing well.  She was informed that the nailbed may be sensitive for a little while until the skin/nailbed toughen's up slightly.  And another month she should start seeing new nail growth coming in.  Informed the patient that there is no guarantees of how normal the new nail will grow in.  Return if symptoms worsen or fail to improve.   Amber Case, DPM, FACFAS [facility]     [address] [place], [place] [address]                Office [phone]  Fax [phone]

## 2023-11-01 ENCOUNTER — Ambulatory Visit: Admitting: Nurse Practitioner

## 2023-11-01 ENCOUNTER — Ambulatory Visit: Payer: Self-pay | Admitting: Nurse Practitioner

## 2023-11-01 VITALS — BP 116/80 | HR 83 | Temp 97.0°F | Ht 64.0 in | Wt 161.6 lb

## 2023-11-01 DIAGNOSIS — J4540 Moderate persistent asthma, uncomplicated: Secondary | ICD-10-CM | POA: Diagnosis not present

## 2023-11-01 DIAGNOSIS — R42 Dizziness and giddiness: Secondary | ICD-10-CM

## 2023-11-01 DIAGNOSIS — Z23 Encounter for immunization: Secondary | ICD-10-CM | POA: Diagnosis not present

## 2023-11-01 LAB — COMPREHENSIVE METABOLIC PANEL WITH GFR
ALT: 9 U/L (ref 0–35)
AST: 15 U/L (ref 0–37)
Albumin: 4.3 g/dL (ref 3.5–5.2)
Alkaline Phosphatase: 43 U/L (ref 39–117)
BUN: 10 mg/dL (ref 6–23)
CO2: 25 meq/L (ref 19–32)
Calcium: 9.3 mg/dL (ref 8.4–10.5)
Chloride: 103 meq/L (ref 96–112)
Creatinine, Ser: 0.59 mg/dL (ref 0.40–1.20)
GFR: 107.49 mL/min (ref >60.00)
Glucose, Bld: 91 mg/dL (ref 70–99)
Potassium: 3.9 meq/L (ref 3.5–5.1)
Sodium: 138 meq/L (ref 135–145)
Total Bilirubin: 0.5 mg/dL (ref 0.2–1.2)
Total Protein: 7 g/dL (ref 6.0–8.3)

## 2023-11-01 LAB — CBC WITH DIFFERENTIAL/PLATELET
Basophils Absolute: 0.1 K/uL (ref 0.0–0.1)
Basophils Relative: 1 % (ref 0.0–3.0)
Eosinophils Absolute: 0.2 K/uL (ref 0.0–0.7)
Eosinophils Relative: 2.7 % (ref 0.0–5.0)
HCT: 38.7 % (ref 36.0–46.0)
Hemoglobin: 13.2 g/dL (ref 12.0–15.0)
Lymphocytes Relative: 34.1 % (ref 12.0–46.0)
Lymphs Abs: 2 K/uL (ref 0.7–4.0)
MCHC: 34.1 g/dL (ref 30.0–36.0)
MCV: 94 fl (ref 78.0–100.0)
Monocytes Absolute: 0.4 K/uL (ref 0.1–1.0)
Monocytes Relative: 6.2 % (ref 3.0–12.0)
Neutro Abs: 3.3 K/uL (ref 1.4–7.7)
Neutrophils Relative %: 56 % (ref 43.0–77.0)
Platelets: 236 K/uL (ref 150.0–400.0)
RBC: 4.12 Mil/uL (ref 3.87–5.11)
RDW: 13.4 % (ref 11.5–15.5)
WBC: 5.9 K/uL (ref 4.0–10.5)

## 2023-11-01 LAB — TSH: TSH: 2.15 u[IU]/mL (ref 0.35–5.50)

## 2023-11-01 MED ORDER — COVID-19 MRNA VAC-TRIS(PFIZER) 30 MCG/0.3ML IM SUSY: 0.3000 mL | PREFILLED_SYRINGE | Freq: Once | INTRAMUSCULAR | 0 refills | Status: AC

## 2023-11-01 NOTE — Assessment & Plan Note (Signed)
 She experienced an acute vertigo episode, likely due to BPPV from otolith misalignment. There has been no recurrence, but she reports residual headache and fogginess. Recommend Dramamine for future episodes. Educated her on the Epley maneuver for otolith repositioning. Check CMP, CBC, TSH today. Advise her to report any recurrence for further evaluation.

## 2023-11-01 NOTE — Progress Notes (Signed)
 Acute Office Visit  Subjective:     Patient ID: Amber Case, female    DOB: May 24, 1975, 48 y.o.   MRN: [id number]  Chief Complaint  Patient presents with   Dizziness    On Thursday had vertigo, vision changes, vomiting, Flu Vaccine, Covid Rx    HPI Discussed the use of AI scribe software for clinical note transcription with the patient, who gave verbal consent to proceed.  History of Present Illness   Amber Case is a 48 year old female who presents with a recent episode of severe vertigo and dizziness.  She experienced a sudden onset of severe dizziness last Thursday after returning home from breakfast. The dizziness was intense, causing her to almost fall several times and lasted approximately three and a half to four hours. During this time, she was unable to move her head without severe vertigo and vomiting. She consumed more caffeine than usual that morning and was exposed to loud bass music, which she felt might have contributed to her symptoms. She is sensitive to noise, experiencing sensations similar to ear pressure changes. After the episode, she slept and felt somewhat better upon waking, though not completely clear-headed. She had a mild headache for a couple of days but no further episodes of vertigo or dizziness. Approximately fifteen years ago, she had a less severe vertigo episode without vomiting. No recent illnesses, shortness of breath, or confusion during the episode. No sneezing spells or other symptoms on the day of the episode.      ROS See pertinent positives and negatives per HPI.     Objective:    BP 116/80 (BP Location: Left Arm, Patient Position: Sitting, Cuff Size: Normal)   Pulse 83   Temp (!) 97 F (36.1 C)   Ht 5' 4 (1.626 m)   Wt 161 lb 9.6 oz (73.3 kg)   LMP 10/06/2023 (Exact Date)   SpO2 98%   BMI 27.74 kg/m    Physical Exam Vitals and nursing note reviewed.  Constitutional:      General: She is not in acute distress.     Appearance: Normal appearance.  HENT:     Head: Normocephalic.     Right Ear: Tympanic membrane, ear canal and external ear normal.     Left Ear: Tympanic membrane, ear canal and external ear normal.  Eyes:     Extraocular Movements: Extraocular movements intact.     Conjunctiva/sclera: Conjunctivae normal.     Pupils: Pupils are equal, round, and reactive to light.  Cardiovascular:     Rate and Rhythm: Normal rate and regular rhythm.     Pulses: Normal pulses.     Heart sounds: Normal heart sounds.  Pulmonary:     Effort: Pulmonary effort is normal.     Breath sounds: Normal breath sounds.  Musculoskeletal:     Cervical back: Normal range of motion.  Skin:    General: Skin is warm.  Neurological:     General: No focal deficit present.     Mental Status: She is alert and oriented to person, place, and time.     Motor: No weakness.     Coordination: Coordination normal.     Gait: Gait normal.     Comments: Dix-hallpike maneuver negative bilaterally   Psychiatric:        Mood and Affect: Mood normal.        Behavior: Behavior normal.        Thought Content: Thought content normal.  Judgment: Judgment normal.      Assessment & Plan:   Problem List Items Addressed This Visit       Respiratory   Moderate persistent asthma without complication   Chronic,stable. She is taking qvar  inhaler BID and ventolin  prn shortness of breath. She is taking claritin daily. She has an appointment with pulmonology next week. Covid-19 vaccine RX printed.       Relevant Medications   COVID-19 mRNA vaccine, Pfizer, (COMIRNATY) syringe     Other   Vertigo - Primary   She experienced an acute vertigo episode, likely due to BPPV from otolith misalignment. There has been no recurrence, but she reports residual headache and fogginess. Recommend Dramamine for future episodes. Educated her on the Epley maneuver for otolith repositioning. Check CMP, CBC, TSH today. Advise her to report any  recurrence for further evaluation.       Relevant Orders   CBC with Differential/Platelet   Comprehensive metabolic panel with GFR   TSH   Other Visit Diagnoses       Immunization due       Flu vaccine given today   Relevant Medications   COVID-19 mRNA vaccine, Pfizer, (COMIRNATY) syringe   Other Relevant Orders   Flu vaccine trivalent PF, 6mos and older(Flulaval,Afluria,Fluarix,Fluzone) (Completed)       Meds ordered this encounter  Medications   COVID-19 mRNA vaccine, Pfizer, (COMIRNATY) syringe    Sig: Inject 0.3 mLs into the muscle once for 1 dose.    Dispense:  0.3 mL    Refill:  0    Approved at provider discretion. Product selection permitted.    Return if symptoms worsen or fail to improve.  Tinnie DELENA Harada, NP

## 2023-11-01 NOTE — Assessment & Plan Note (Signed)
 Chronic,stable. She is taking qvar  inhaler BID and ventolin  prn shortness of breath. She is taking claritin daily. She has an appointment with pulmonology next week. Covid-19 vaccine RX printed.

## 2023-11-01 NOTE — Patient Instructions (Signed)
 It was great to see you!  We are checking your labs today and will let you know the results via mychart/phone.   You can try dramamine over the counter if this happens again.   Please let me know if it happens again   Let's follow-up if symptoms worsen or don't improve   Take care,  Tinnie Harada, NP

## 2023-11-09 ENCOUNTER — Other Ambulatory Visit (HOSPITAL_BASED_OUTPATIENT_CLINIC_OR_DEPARTMENT_OTHER): Payer: Self-pay

## 2023-11-09 ENCOUNTER — Ambulatory Visit (HOSPITAL_BASED_OUTPATIENT_CLINIC_OR_DEPARTMENT_OTHER): Admitting: Pulmonary Disease

## 2023-11-09 ENCOUNTER — Encounter (HOSPITAL_BASED_OUTPATIENT_CLINIC_OR_DEPARTMENT_OTHER): Payer: Self-pay | Admitting: Pulmonary Disease

## 2023-11-09 VITALS — BP 115/81 | HR 76 | Ht 64.0 in | Wt 162.0 lb

## 2023-11-09 DIAGNOSIS — J4540 Moderate persistent asthma, uncomplicated: Secondary | ICD-10-CM | POA: Diagnosis not present

## 2023-11-09 NOTE — Progress Notes (Signed)
 Subjective:   PATIENT ID: Amber Case GENDER: female DOB: [birth year], MRN: [id number]  Chief Complaint  Patient presents with   Consult    Asthma    Reason for Visit: New consult for asthma  Amber Case is a 48 year old female never smoker with asthma, PSVT, IBS, HLD, reflux and anxiety who presents as a new consult for asthma evaluation.  Starting in Spring 2023 she began coughing associated with a prolonged wheeze and treated with albuterol  with relief. She has worsening symptoms in fall and spring. She was started Qvar  40 mcg and takes two puffs in the morning during the summer and will increase to twice a day during her bad months. Last exacerbation requiring steroids was 11/2022. In the past her symptoms are severe with coughing, shortness of breath and wheezing with nocturnal symptoms. 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  Social History: Never smoker. 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  Past Medical History:  Diagnosis Date   Abdominal hernia    Anxiety    Gestational diabetes    First pregnancy   H. pylori infection    History of abnormal cervical Pap smear    Irritable bowel syndrome    Palpitations    SVT (supraventricular tachycardia)    Tubular adenoma of colon      Family History  Problem Relation Age of Onset   Heart murmur Mother    Colitis Mother    Irritable bowel syndrome Mother    High blood pressure Brother    Irritable bowel syndrome Brother    Heart attack Maternal Grandmother    Heart disease Maternal Grandmother    Prostate cancer Paternal Grandfather    Diabetes Maternal Aunt    Heart disease Maternal Aunt    Diabetes Maternal Uncle    Diabetes Paternal Aunt    Diabetes Paternal Uncle    Stomach cancer Maternal Grandfather      Social History    Occupational History   Occupation: homemaker  Tobacco Use   Smoking status: Never   Smokeless tobacco: Never  Vaping Use   Vaping status: Never Used  Substance and Sexual Activity   Alcohol use: No   Drug use: No   Sexual activity: Yes    Partners: Male    Birth control/protection: Condom    Allergies  Allergen Reactions   Sulfa Antibiotics Hives   Ofloxacin Itching    Hives; Sweating   Sulfonamide Derivatives      Outpatient Medications Prior to Visit  Medication Sig Dispense Refill   cholecalciferol (VITAMIN D3) 25 MCG (1000 UNIT) tablet Take 2,000 Units by mouth daily.     Loratadine (CLARITIN PO) Take by mouth.     LORazepam  (ATIVAN ) 0.5 MG tablet Take 1 tablet (0.5 mg total) by mouth 2 (two) times daily as needed (severe anxiety/panic). 30 tablet 0   MAGNESIUM PO Take 150 mg by mouth daily.     Multiple Vitamin (MULTIVITAMIN) tablet Take 1 tablet by mouth daily.     omeprazole  (PRILOSEC) 40 MG capsule TAKE 1 CAPSULE (40 MG TOTAL) BY MOUTH DAILY. 90 capsule 3   QVAR  REDIHALER 40 MCG/ACT inhaler INHALE 2 PUFFS INTO THE LUNGS TWICE A DAY 10.6 g 7   sertraline  (ZOLOFT ) 50 MG tablet Take 1 tablet (50 mg total) by mouth daily. 90 tablet 3   valACYclovir  (VALTREX ) 1000 MG  tablet Take 1 tablet (1,000 mg total) by mouth 3 (three) times daily. For 10 days as needed for outbreak (Patient taking differently: Take 1,000 mg by mouth as needed. For 10 days as needed for outbreak) 30 tablet 2   valACYclovir  (VALTREX ) 500 MG tablet Take 1 tablet (500 mg total) by mouth daily. (Patient taking differently: Take 500 mg by mouth as needed.) 90 tablet 0   VENTOLIN  HFA 108 (90 Base) MCG/ACT inhaler Inhale 1-2 puffs into the lungs every 4 (four) hours as needed for wheezing or shortness of breath. 8 g 6   No facility-administered medications prior to visit.    Review of Systems  Constitutional:  Negative for chills, diaphoresis, fever, malaise/fatigue and weight loss.  HENT:  Negative for  congestion.   Respiratory:  Negative for cough, hemoptysis, sputum production, shortness of breath and wheezing.   Cardiovascular:  Negative for chest pain, palpitations and leg swelling.     Objective:   Vitals:   11/09/23 1006  BP: 115/81  Pulse: 76  SpO2: 98%  Weight: 162 lb (73.5 kg)  Height: 5' 4 (1.626 m)   SpO2: 98 %  Physical Exam: General: Well-appearing, no acute distress HENT: [place], AT Eyes: EOMI, no scleral icterus Respiratory: Clear to auscultation bilaterally.  No crackles, wheezing or rales Cardiovascular: RRR, -M/R/G, no JVD Extremities:-Edema,-tenderness Neuro: AAO x4, CNII-XII grossly intact Psych: Normal mood, normal affect  Data Reviewed:  Imaging: CXR 12/09/22 - No acute cardiopulmonary process  PFT: None on file  Labs: CBC    Component Value Date/Time   WBC 5.9 11/01/2023 0849   RBC 4.12 11/01/2023 0849   HGB 13.2 11/01/2023 0849   HCT 38.7 11/01/2023 0849   PLT 236.0 11/01/2023 0849   MCV 94.0 11/01/2023 0849   MCH 31.9 10/12/2018 0928   MCHC 34.1 11/01/2023 0849   RDW 13.4 11/01/2023 0849   LYMPHSABS 2.0 11/01/2023 0849   MONOABS 0.4 11/01/2023 0849   EOSABS 0.2 11/01/2023 0849   BASOSABS 0.1 11/01/2023 0849   Absolute eos 11/01/23 - 200     Assessment & Plan:   Discussion: 48 year old female never smoker with asthma, PSVT, IBS, HLD, reflux and anxiety who presents as a new consult for asthma evaluation. Discussed clinical course and management of asthma including bronchodilator regimen, preventive care including vaccinations and action plan for exacerbation.   Asthma --CONTINUE Qvar  2 puffs ONCE a day until after your pulmonary function tests. Will call with test results --CONTINUE albuterol  TWO puffs every 4-6 hours as needed --Will price check ICS/LABA with plan to step up in the winter  Health Maintenance Immunization History  Administered Date(s) Administered   Influenza, Seasonal, Injecte, Preservative Fre 11/24/2011,  11/01/2023   Influenza,inj,Quad PF,6+ Mos 10/12/2018, 11/27/2021   Influenza-Unspecified 11/24/2011, 12/04/2020   PFIZER(Purple Top)SARS-COV-2 Vaccination 05/01/2019, 05/22/2019, 12/17/2019   Td 01/08/2016   Tdap 01/08/2016, 01/08/2016   CT Lung Screen - not qualified  Orders Placed This Encounter  Procedures   Pulmonary function test    Standing Status:   Future    Expiration Date:   11/08/2024    Where should this test be performed?:   Outpatient Pulmonary    What type of PFT is being ordered?:   Full PFT  No orders of the defined types were placed in this encounter.   Return in about 3 months (around 02/09/2024).  I have spent a total time of 45-minutes on the day of the appointment reviewing prior documentation, coordinating care and discussing  medical diagnosis and plan with the patient/family. Imaging, labs and tests included in this note have been reviewed and interpreted independently by me.  Llewelyn Sheaffer Slater Staff, MD [facility] 11/09/2023 10:25 AM

## 2023-11-09 NOTE — Patient Instructions (Addendum)
 Asthma --CONTINUE Qvar  2 puffs ONCE a day until after your pulmonary function tests. Will call with test results --CONTINUE albuterol  TWO puffs every 4-6 hours as needed --Will price check ICS/LABA with plan to step up in the winter

## 2023-11-16 ENCOUNTER — Ambulatory Visit (HOSPITAL_BASED_OUTPATIENT_CLINIC_OR_DEPARTMENT_OTHER)

## 2023-11-16 DIAGNOSIS — J4540 Moderate persistent asthma, uncomplicated: Secondary | ICD-10-CM

## 2023-11-16 LAB — PULMONARY FUNCTION TEST
DL/VA % pred: 113 %
DL/VA: 4.93 ml/min/mmHg/L
DLCO cor % pred: 109 %
DLCO cor: 23.39 ml/min/mmHg
DLCO unc % pred: 109 %
DLCO unc: 23.24 ml/min/mmHg
FEF 25-75 Post: 4.63 L/s
FEF 25-75 Pre: 3.65 L/s
FEF2575-%Change-Post: 26 %
FEF2575-%Pred-Post: 160 %
FEF2575-%Pred-Pre: 126 %
FEV1-%Change-Post: 7 %
FEV1-%Pred-Post: 113 %
FEV1-%Pred-Pre: 105 %
FEV1-Post: 3.26 L
FEV1-Pre: 3.03 L
FEV1FVC-%Change-Post: 3 %
FEV1FVC-%Pred-Pre: 107 %
FEV6-%Change-Post: 4 %
FEV6-%Pred-Post: 103 %
FEV6-%Pred-Pre: 99 %
FEV6-Post: 3.62 L
FEV6-Pre: 3.48 L
FEV6FVC-%Pred-Post: 102 %
FEV6FVC-%Pred-Pre: 102 %
FVC-%Change-Post: 4 %
FVC-%Pred-Post: 100 %
FVC-%Pred-Pre: 96 %
FVC-Post: 3.62 L
FVC-Pre: 3.48 L
Post FEV1/FVC ratio: 90 %
Post FEV6/FVC ratio: 100 %
Pre FEV1/FVC ratio: 87 %
Pre FEV6/FVC Ratio: 100 %
RV % pred: 66 %
RV: 1.15 L
TLC % pred: 100 %
TLC: 5.06 L

## 2023-11-16 NOTE — Progress Notes (Signed)
 Full PFT performed today.

## 2023-11-16 NOTE — Patient Instructions (Signed)
 Full PFT performed today.

## 2023-11-18 ENCOUNTER — Ambulatory Visit: Payer: Self-pay | Admitting: Pulmonary Disease

## 2023-11-19 MED ORDER — BUDESONIDE-FORMOTEROL FUMARATE 80-4.5 MCG/ACT IN AERO: 2.0000 | INHALATION_SPRAY | Freq: Two times a day (BID) | RESPIRATORY_TRACT | 3 refills | Status: DC

## 2023-11-19 NOTE — Telephone Encounter (Signed)
**Note De-identified  [place] Obfuscation** Please advise 

## 2024-02-16 ENCOUNTER — Ambulatory Visit (HOSPITAL_BASED_OUTPATIENT_CLINIC_OR_DEPARTMENT_OTHER): Admitting: Pulmonary Disease

## 2024-02-17 ENCOUNTER — Encounter (HOSPITAL_BASED_OUTPATIENT_CLINIC_OR_DEPARTMENT_OTHER): Payer: Self-pay | Admitting: Pulmonary Disease

## 2024-02-17 ENCOUNTER — Ambulatory Visit (INDEPENDENT_AMBULATORY_CARE_PROVIDER_SITE_OTHER): Admitting: Pulmonary Disease

## 2024-02-17 VITALS — BP 124/86 | HR 86 | Ht 64.0 in | Wt 163.3 lb

## 2024-02-17 DIAGNOSIS — E785 Hyperlipidemia, unspecified: Secondary | ICD-10-CM

## 2024-02-17 DIAGNOSIS — J4540 Moderate persistent asthma, uncomplicated: Secondary | ICD-10-CM

## 2024-02-17 DIAGNOSIS — K589 Irritable bowel syndrome without diarrhea: Secondary | ICD-10-CM | POA: Diagnosis not present

## 2024-02-17 DIAGNOSIS — F419 Anxiety disorder, unspecified: Secondary | ICD-10-CM

## 2024-02-17 DIAGNOSIS — J45909 Unspecified asthma, uncomplicated: Secondary | ICD-10-CM | POA: Diagnosis not present

## 2024-02-17 MED ORDER — SYMBICORT 80-4.5 MCG/ACT IN AERO: 2.0000 | INHALATION_SPRAY | Freq: Two times a day (BID) | RESPIRATORY_TRACT | 11 refills | Status: AC

## 2024-02-17 NOTE — Progress Notes (Signed)
 "   Subjective:   PATIENT ID: Amber Case GENDER: female DOB: [birth year], MRN: [id number]  Chief Complaint  Patient presents with   Asthma    Reason for Visit: Follow-up  [name] is a 49 year old female never smoker with asthma, PSVT, IBS, HLD, reflux and anxiety who presents for asthma follow-up.  Initial consult Starting in Spring 2023 she began coughing associated with a prolonged wheeze and treated with albuterol  with relief. She has worsening symptoms in fall and spring. She was started Qvar  40 mcg and takes two puffs in the morning during the summer and will increase to twice a day during her bad months. Last exacerbation requiring steroids was 11/2022. In the past her symptoms are severe with coughing, shortness of breath and wheezing with nocturnal symptoms. 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  02/17/24 Since our last visit she was started on low dose brand name symbicort . No exacerbations since our last visit. Previously she has had worsening symptoms in fall and di not experience it this yet. Denies shortness of breath, cough or wheezing.   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 pylori infection    History of abnormal cervical Pap smear    Irritable bowel syndrome    Palpitations    SVT (supraventricular tachycardia)    Tubular adenoma of colon      Family History  Problem Relation Age of Onset   Heart murmur Mother    Colitis Mother    Irritable bowel syndrome Mother    High blood pressure Brother    Irritable bowel syndrome Brother    Heart attack Maternal Grandmother    Heart disease Maternal Grandmother    Prostate cancer Paternal Grandfather     Diabetes Maternal Aunt    Heart disease Maternal Aunt    Diabetes Maternal Uncle    Diabetes Paternal Aunt    Diabetes Paternal Uncle    Stomach cancer Maternal Grandfather      Social History   Occupational History   Occupation: homemaker  Tobacco Use   Smoking status: Never   Smokeless tobacco: Never  Vaping Use   Vaping status: Never Used  Substance and Sexual Activity   Alcohol use: No   Drug use: No   Sexual activity: Yes    Partners: Male    Birth control/protection: Condom    Allergies  Allergen Reactions   Sulfa Antibiotics Hives   Ofloxacin Itching    Hives; Sweating   Sulfonamide Derivatives      Outpatient Medications Prior to Visit  Medication Sig Dispense Refill   cholecalciferol (VITAMIN D3) 25 MCG (1000 UNIT) tablet Take 2,000 Units by mouth daily.     Loratadine (CLARITIN PO) Take by mouth.     LORazepam  (ATIVAN ) 0.5 MG tablet Take 1 tablet (0.5 mg total) by mouth 2 (two) times daily as needed (severe anxiety/panic). 30 tablet 0   MAGNESIUM PO Take 150 mg by mouth daily.     Multiple Vitamin (MULTIVITAMIN) tablet Take 1 tablet by mouth daily.     omeprazole  (PRILOSEC) 40 MG capsule TAKE 1 CAPSULE (40 MG TOTAL) BY MOUTH DAILY. 90 capsule 3  sertraline  (ZOLOFT ) 50 MG tablet Take 1 tablet (50 mg total) by mouth daily. 90 tablet 3   valACYclovir  (VALTREX ) 1000 MG tablet Take 1 tablet (1,000 mg total) by mouth 3 (three) times daily. For 10 days as needed for outbreak (Patient taking differently: Take 1,000 mg by mouth as needed. For 10 days as needed for outbreak) 30 tablet 2   valACYclovir  (VALTREX ) 500 MG tablet Take 1 tablet (500 mg total) by mouth daily. (Patient taking differently: Take 500 mg by mouth as needed.) 90 tablet 0   VENTOLIN  HFA 108 (90 Base) MCG/ACT inhaler Inhale 1-2 puffs into the lungs every 4 (four) hours as needed for wheezing or shortness of breath. 8 g 6   budesonide -formoterol  (SYMBICORT ) 80-4.5 MCG/ACT inhaler Inhale 2 puffs into the  lungs 2 (two) times daily. 10.2 g 3   No facility-administered medications prior to visit.    Review of Systems  Constitutional:  Negative for chills, diaphoresis, fever, malaise/fatigue and weight loss.  HENT:  Negative for congestion.   Respiratory:  Negative for cough, hemoptysis, sputum production, shortness of breath and wheezing.   Cardiovascular:  Negative for chest pain, palpitations and leg swelling.     Objective:   Vitals:   02/17/24 1104  BP: 124/86  Pulse: 86  SpO2: 97%  Weight: 163 lb 4.8 oz (74.1 kg)  Height: 5' 4 (1.626 m)   SpO2: 97 %  Physical Exam: General: Well-appearing, no acute distress HENT: , AT Eyes: EOMI, no scleral icterus Respiratory: Clear to auscultation bilaterally.  No crackles, wheezing or rales Cardiovascular: RRR, -M/R/G, no JVD Extremities:-Edema,-tenderness Neuro: AAO x4, CNII-XII grossly intact Psych: Normal mood, normal affect  Data Reviewed:  Imaging: CXR 12/09/22 - No acute cardiopulmonary process  PFT: 11/16/23 FVC 3.62 (100%) FEV1 3.26 (113%) Ratio 87  TLC 100% DLCO 109% Interpretation: Normal pulmonary function tests  Labs: CBC    Component Value Date/Time   WBC 5.9 11/01/2023 0849   RBC 4.12 11/01/2023 0849   HGB 13.2 11/01/2023 0849   HCT 38.7 11/01/2023 0849   PLT 236.0 11/01/2023 0849   MCV 94.0 11/01/2023 0849   MCH 31.9 10/12/2018 0928   MCHC 34.1 11/01/2023 0849   RDW 13.4 11/01/2023 0849   LYMPHSABS 2.0 11/01/2023 0849   MONOABS 0.4 11/01/2023 0849   EOSABS 0.2 11/01/2023 0849   BASOSABS 0.1 11/01/2023 0849   Absolute eos 11/01/23 - 200     Assessment & Plan:   Discussion: 49 year old female never smoker with asthma, PSVT, IBS, HLD, reflux and anxiety who presents for asthma follow-up. Discussed clinical course and management of asthma including bronchodilator regimen, preventive care and action plan for exacerbation.   Asthma --CONTINUE brand name Symbicort  2 puffs TWICE a day   >OK to  decrease inhaler use in June. Restart in September  --CONTINUE albuterol  TWO puffs every 4-6 hours as needed  Health Maintenance Immunization History  Administered Date(s) Administered   Influenza, Seasonal, Injecte, Preservative Fre 11/24/2011, 11/01/2023   Influenza,inj,Quad PF,6+ Mos 10/12/2018, 11/27/2021   Influenza-Unspecified 11/24/2011, 12/04/2020   PFIZER(Purple Top)SARS-COV-2 Vaccination 05/01/2019, 05/22/2019, 12/17/2019   Td 01/08/2016   Tdap 01/08/2016, 01/08/2016   CT Lung Screen - not qualified  No orders of the defined types were placed in this encounter.  Meds ordered this encounter  Medications   SYMBICORT  80-4.5 MCG/ACT inhaler    Sig: Inhale 2 puffs into the lungs 2 (two) times daily.    Dispense:  10.2 g    Refill:  11    Brand name only    Return in about 7 months (around 09/16/2024).  I have spent a total time of 30-minutes on the day of the appointment including chart review, data review, collecting history, coordinating care and discussing medical diagnosis and plan with the patient/family. Past medical history, allergies, medications were reviewed. Pertinent imaging, labs and tests included in this note have been reviewed and interpreted independently by me.  Amber Case Staff, MD [facility] 02/17/2024 5:30 PM     "

## 2024-02-17 NOTE — Patient Instructions (Signed)
 Asthma --CONTINUE brand name Symbicort  2 puffs TWICE a day   >OK to decrease inhaler use in June. Restart in September  --CONTINUE albuterol  TWO puffs every 4-6 hours as needed

## 2024-02-20 ENCOUNTER — Other Ambulatory Visit: Payer: Self-pay | Admitting: Pulmonary Disease

## 2024-05-04 ENCOUNTER — Encounter: Admitting: Nurse Practitioner
